# Patient Record
Sex: Female | Born: 1970 | Race: Black or African American | Hispanic: No
Health system: Southern US, Community
[De-identification: ages and names within clinical notes are randomized; demographics above are authoritative.]

---

## 2020-12-23 ENCOUNTER — Emergency Department (HOSPITAL_COMMUNITY)
Admission: EM | Admit: 2020-12-23 | Discharge: 2020-12-23 | Disposition: A | Discharge status: Home / Self Care | Payer: Medicaid Other | Attending: Emergency Medicine | Admitting: Emergency Medicine

## 2020-12-23 ENCOUNTER — Encounter (HOSPITAL_COMMUNITY): Payer: Self-pay

## 2020-12-23 ENCOUNTER — Emergency Department (HOSPITAL_COMMUNITY): Payer: Medicaid Other

## 2020-12-23 ENCOUNTER — Other Ambulatory Visit: Payer: Self-pay

## 2020-12-23 DIAGNOSIS — N200 Calculus of kidney: Secondary | ICD-10-CM | POA: Diagnosis not present

## 2020-12-23 DIAGNOSIS — R0781 Pleurodynia: Secondary | ICD-10-CM | POA: Diagnosis not present

## 2020-12-23 DIAGNOSIS — R109 Unspecified abdominal pain: Secondary | ICD-10-CM | POA: Diagnosis present

## 2020-12-23 DIAGNOSIS — M545 Low back pain, unspecified: Secondary | ICD-10-CM

## 2020-12-23 DIAGNOSIS — Z853 Personal history of malignant neoplasm of breast: Secondary | ICD-10-CM | POA: Insufficient documentation

## 2020-12-23 DIAGNOSIS — M79606 Pain in leg, unspecified: Secondary | ICD-10-CM | POA: Diagnosis not present

## 2020-12-23 LAB — COMPREHENSIVE METABOLIC PANEL
ALT: 21 U/L (ref 0–44)
AST: 39 U/L (ref 15–41)
Albumin: 3.6 g/dL (ref 3.5–5.0)
Alkaline Phosphatase: 110 U/L (ref 38–126)
Anion gap: 9 (ref 5–15)
BUN: 17 mg/dL (ref 6–20)
CO2: 26 mmol/L (ref 22–32)
Calcium: 10.4 mg/dL — ABNORMAL HIGH (ref 8.9–10.3)
Chloride: 103 mmol/L (ref 98–111)
Creatinine, Ser: 0.64 mg/dL (ref 0.44–1.00)
GFR, Estimated: >60 mL/min (ref >60)
Glucose, Bld: 95 mg/dL (ref 70–99)
Potassium: 3.7 mmol/L (ref 3.5–5.1)
Sodium: 138 mmol/L (ref 135–145)
Total Bilirubin: 0.6 mg/dL (ref 0.3–1.2)
Total Protein: 7.5 g/dL (ref 6.5–8.1)

## 2020-12-23 LAB — CBC WITH DIFFERENTIAL/PLATELET
Abs Immature Granulocytes: 0.02 10*3/uL (ref 0.00–0.07)
Basophils Absolute: 0 10*3/uL (ref 0.0–0.1)
Basophils Relative: 0 %
Eosinophils Absolute: 0.2 10*3/uL (ref 0.0–0.5)
Eosinophils Relative: 3 %
HCT: 32.4 % — ABNORMAL LOW (ref 36.0–46.0)
Hemoglobin: 10.4 g/dL — ABNORMAL LOW (ref 12.0–15.0)
Immature Granulocytes: 0 %
Lymphocytes Relative: 21 %
Lymphs Abs: 1.1 10*3/uL (ref 0.7–4.0)
MCH: 28.2 pg (ref 26.0–34.0)
MCHC: 32.1 g/dL (ref 30.0–36.0)
MCV: 87.8 fL (ref 80.0–100.0)
Monocytes Absolute: 0.8 10*3/uL (ref 0.1–1.0)
Monocytes Relative: 15 %
Neutro Abs: 3.2 10*3/uL (ref 1.7–7.7)
Neutrophils Relative %: 61 %
Platelets: 142 10*3/uL — ABNORMAL LOW (ref 150–400)
RBC: 3.69 MIL/uL — ABNORMAL LOW (ref 3.87–5.11)
RDW: 13.1 % (ref 11.5–15.5)
WBC: 5.3 10*3/uL (ref 4.0–10.5)
nRBC: 0 % (ref 0.0–0.2)

## 2020-12-23 LAB — URINALYSIS, ROUTINE W REFLEX MICROSCOPIC
Bilirubin Urine: NEGATIVE
Glucose, UA: NEGATIVE mg/dL
Hgb urine dipstick: NEGATIVE
Ketones, ur: NEGATIVE mg/dL
Leukocytes,Ua: NEGATIVE
Nitrite: NEGATIVE
Protein, ur: NEGATIVE mg/dL
Specific Gravity, Urine: 1.024 (ref 1.005–1.030)
pH: 5 (ref 5.0–8.0)

## 2020-12-23 LAB — I-STAT BETA HCG BLOOD, ED (MC, WL, AP ONLY): I-stat hCG, quantitative: <5 m[IU]/mL (ref <5)

## 2020-12-23 LAB — TROPONIN I (HIGH SENSITIVITY): Troponin I (High Sensitivity): 2 ng/L (ref <18)

## 2020-12-23 MED ORDER — HYDROMORPHONE HCL 1 MG/ML IJ SOLN
1.0000 mg | Freq: Once | INTRAMUSCULAR | Status: AC
  Administered 2020-12-23: 1 mg via INTRAVENOUS
  Filled 2020-12-23: qty 1

## 2020-12-23 MED ORDER — HYDROMORPHONE HCL 1 MG/ML IJ SOLN
1.0000 mg | Freq: Once | INTRAMUSCULAR | Status: AC
Start: 2020-12-23 — End: 2020-12-23
  Administered 2020-12-23: 1 mg via INTRAVENOUS
  Filled 2020-12-23: qty 1

## 2020-12-23 MED ORDER — SODIUM CHLORIDE (PF) 0.9 % IJ SOLN
INTRAMUSCULAR | Status: AC
  Filled 2020-12-23: qty 50

## 2020-12-23 MED ORDER — OXYCODONE HCL 10 MG PO TABS: 10.0000 mg | ORAL_TABLET | Freq: Four times a day (QID) | ORAL | 0 refills | Status: DC | PRN

## 2020-12-23 MED ORDER — ONDANSETRON HCL 4 MG/2ML IJ SOLN
4.0000 mg | Freq: Once | INTRAMUSCULAR | Status: AC
  Administered 2020-12-23: 4 mg via INTRAVENOUS
  Filled 2020-12-23: qty 2

## 2020-12-23 MED ORDER — IOHEXOL 350 MG/ML SOLN
80.0000 mL | Freq: Once | INTRAVENOUS | Status: AC | PRN
  Administered 2020-12-23: 80 mL via INTRAVENOUS

## 2020-12-23 NOTE — Discharge Instructions (Addendum)
Return for any problem.  ?

## 2020-12-23 NOTE — ED Provider Notes (Signed)
[facility] Emergency Department Provider Note MRN:  [id number]  Arrival date & time: 12/23/20     Chief Complaint   Flank Pain   History of Present Illness   Ashley Ortega is a 50 y.o. year-old female with history of stage IV breast cancer presenting to the ED with chief complaint of leg pain.  Pain located in the left flank and left lateral ribs.  Severe constant since 3 hours ago, comes and goes in waves but is always there.  Denies any trouble urinating or trouble with bowel movements, no dysuria, no numbness or weakness to the arms or legs, no lower abdominal pain, no shortness of breath or chest pain  Review of Systems  A complete 10 system review of systems was obtained and all systems are negative except as noted in the HPI and PMH.   Patient's Health History   History reviewed. No pertinent past medical history.    No family history on file.  Social History   Socioeconomic History   Marital status: Unknown    Spouse name: Not on file   Number of children: Not on file   Years of education: Not on file   Highest education level: Not on file  Occupational History   Not on file  Tobacco Use   Smoking status: Not on file   Smokeless tobacco: Not on file  Substance and Sexual Activity   Alcohol use: Not on file   Drug use: Not on file   Sexual activity: Not on file  Other Topics Concern   Not on file  Social History Narrative   Not on file   Social Determinants of Health   Financial Resource Strain: Not on file  Food Insecurity: Not on file  Transportation Needs: Not on file  Physical Activity: Not on file  Stress: Not on file  Social Connections: Not on file  Intimate Partner Violence: Not on file     Physical Exam   Vitals:   12/23/20 0501 12/23/20 0600  BP:  120/71  Pulse:  83  Resp:  17  Temp:    SpO2: 94% 98%    CONSTITUTIONAL: Well-appearing, in moderate distress due to pain NEURO:  Alert and oriented x 3, no focal  deficits EYES:  eyes equal and reactive ENT/NECK:  no LAD, no JVD CARDIO: Regular rate, well-perfused, normal S1 and S2 PULM:  CTAB no wheezing or rhonchi GI/GU:  normal bowel sounds, non-distended, non-tender MSK/SPINE:  No gross deformities, no edema SKIN:  no rash, atraumatic PSYCH:  Appropriate speech and behavior  *Additional and/or pertinent findings included in MDM below  Diagnostic and Interventional Summary    EKG Interpretation  Date/Time:  Sunday December 23 2020 05:22:19 EDT Ventricular Rate:  73 PR Interval:  223 QRS Duration: 81 QT Interval:  359 QTC Calculation: 396 R Axis:   75 Text Interpretation: Sinus rhythm Prolonged PR interval Confirmed by Ashley Ortega [phone]) on 12/23/2020 6:58:29 AM       Labs Reviewed  CBC WITH DIFFERENTIAL/PLATELET - Abnormal; Notable for the following components:      Result Value   RBC 3.69 (*)    Hemoglobin 10.4 (*)    HCT 32.4 (*)    Platelets 142 (*)    All other components within normal limits  COMPREHENSIVE METABOLIC PANEL - Abnormal; Notable for the following components:   Calcium 10.4 (*)    All other components within normal limits  URINALYSIS, ROUTINE W REFLEX MICROSCOPIC  I-STAT BETA HCG  BLOOD, ED (MC, [facility], AP ONLY)  TROPONIN I (HIGH SENSITIVITY)    CT ABDOMEN PELVIS W CONTRAST    (Results Pending)  CT L-SPINE NO CHARGE    (Results Pending)  CT Angio Chest Pulmonary Embolism (PE) W or WO Contrast    (Results Pending)    Medications  sodium chloride (PF) 0.9 % injection (has no administration in time range)  HYDROmorphone (DILAUDID) injection 1 mg (1 mg Intravenous Given 12/23/20 0431)  ondansetron (ZOFRAN) injection 4 mg (4 mg Intravenous Given 12/23/20 0428)  iohexol (OMNIPAQUE) 350 MG/ML injection 80 mL (80 mLs Intravenous Contrast Given 12/23/20 0615)     Procedures  /  Critical Care Procedures  ED Course and Medical Decision Making  I have reviewed the triage vital signs, the nursing notes, and pertinent  available records from the EMR.  Listed above are laboratory and imaging tests that I personally ordered, reviewed, and interpreted and then considered in my medical decision making (see below for details).  CT to evaluate for PE, complication of known stage IV cancer, kidney stone.     Awaiting CT imaging.  Labs reassuring, pain seems adequately controlled.  Pain may be related to known bony mets.  Suspect patient will be appropriate for discharge if imaging is reassuring.  Signed out to oncoming provider at shift change.  Barth Kirks. Sedonia Small, MD [place] [email or web address]  Final Clinical Impressions(s) / ED Diagnoses     ICD-10-CM   1. Flank pain  R10.9     2. Low back pain  M54.50 CT L-SPINE NO CHARGE    CT L-SPINE NO CHARGE      ED Discharge Orders     None        Discharge Instructions Discussed with and Provided to Patient:   Discharge Instructions   None       Ashley Flakes, MD 12/23/20 [phone]

## 2020-12-23 NOTE — ED Provider Notes (Signed)
Patient seen after prior ED provider.  Patient with history of known metastatic stage IV breast cancer.  Patient has declined chemotherapy and radiation or other aggressive treatment.  Patient is scheduled for palliative care consult on the 27th of this month.  Patient reports increased low back and flank pain.  This appears to be secondary to progression of her disease.  Up until this point, patient has used minimal pain medication.  She reports intermittent use of Percocet 2.5/325.  This has been an adequate for her pain control.  Imaging studies from today do not demonstrate evidence of new significant pathology.  She does have evidence of metastatic disease.  This does not appear to be significantly different than in earlier imaging obtained earlier this month.  Patient does feel improved after ED evaluation.  She is agreeable with plan for discharge.  She will be provided with a small amount of oxycodone for pain relief pending her palliative care consult on the 27th.   Valarie Merino, MD 12/23/20 [phone]

## 2020-12-23 NOTE — ED Provider Notes (Signed)
Emergency Medicine Provider Triage Evaluation Note  Ashley Ortega , a 50 y.o. female  was evaluated in triage.  Pt complains of left sided abdominal pain.  Hx of stage 4 breast cancer.  Not on chemo/radiation, but is followed by [facility].  Sudden onset pain tonight.  Has mets to the bones.  Review of Systems  Positive: Left abdominal pain, fall Negative: Vomiting, diarrhea  Physical Exam  There were no vitals taken for this visit. Gen:   Awake, no distress   Resp:  Normal effort  MSK:   Moves extremities without difficulty  Other:  Left flank TTP  Medical Decision Making  Medically screening exam initiated at 2:00 AM.  Appropriate orders placed.  Ashley Ortega was informed that the remainder of the evaluation will be completed by another provider, this initial triage assessment does not replace that evaluation, and the importance of remaining in the ED until their evaluation is complete.  Abdominal pain   Montine Circle, PA-C 12/23/20 0202    [name], MD 12/23/20 [phone]

## 2020-12-23 NOTE — ED Triage Notes (Signed)
Pt came in via EMS with c/o L sided flank/back pain. Pain caused her to fall but no injury noted. Pt has hx of stage 4 breast cancer

## 2021-01-14 ENCOUNTER — Encounter (HOSPITAL_COMMUNITY): Payer: Self-pay | Admitting: *Deleted

## 2021-01-14 ENCOUNTER — Emergency Department (HOSPITAL_COMMUNITY): Payer: Medicaid Other

## 2021-01-14 ENCOUNTER — Inpatient Hospital Stay (HOSPITAL_COMMUNITY)
Admission: EM | Admit: 2021-01-14 | Discharge: 2021-01-21 | DRG: 948 | Disposition: A | Discharge status: Home Health | Payer: Medicaid Other | Attending: Internal Medicine | Admitting: Internal Medicine

## 2021-01-14 DIAGNOSIS — K5900 Constipation, unspecified: Secondary | ICD-10-CM | POA: Diagnosis present

## 2021-01-14 DIAGNOSIS — Z515 Encounter for palliative care: Secondary | ICD-10-CM | POA: Diagnosis not present

## 2021-01-14 DIAGNOSIS — G893 Neoplasm related pain (acute) (chronic): Principal | ICD-10-CM | POA: Diagnosis present

## 2021-01-14 DIAGNOSIS — C50911 Malignant neoplasm of unspecified site of right female breast: Secondary | ICD-10-CM | POA: Diagnosis present

## 2021-01-14 DIAGNOSIS — Z681 Body mass index (BMI) 19 or less, adult: Secondary | ICD-10-CM | POA: Diagnosis not present

## 2021-01-14 DIAGNOSIS — C50912 Malignant neoplasm of unspecified site of left female breast: Secondary | ICD-10-CM | POA: Diagnosis present

## 2021-01-14 DIAGNOSIS — Z21 Asymptomatic human immunodeficiency virus [HIV] infection status: Secondary | ICD-10-CM | POA: Diagnosis present

## 2021-01-14 DIAGNOSIS — C799 Secondary malignant neoplasm of unspecified site: Secondary | ICD-10-CM

## 2021-01-14 DIAGNOSIS — R64 Cachexia: Secondary | ICD-10-CM | POA: Diagnosis present

## 2021-01-14 DIAGNOSIS — Z86718 Personal history of other venous thrombosis and embolism: Secondary | ICD-10-CM | POA: Diagnosis not present

## 2021-01-14 DIAGNOSIS — Z20822 Contact with and (suspected) exposure to covid-19: Secondary | ICD-10-CM | POA: Diagnosis present

## 2021-01-14 DIAGNOSIS — E440 Moderate protein-calorie malnutrition: Secondary | ICD-10-CM | POA: Diagnosis present

## 2021-01-14 DIAGNOSIS — Z79899 Other long term (current) drug therapy: Secondary | ICD-10-CM

## 2021-01-14 DIAGNOSIS — Z7952 Long term (current) use of systemic steroids: Secondary | ICD-10-CM | POA: Diagnosis not present

## 2021-01-14 DIAGNOSIS — R52 Pain, unspecified: Secondary | ICD-10-CM | POA: Diagnosis not present

## 2021-01-14 DIAGNOSIS — C50919 Malignant neoplasm of unspecified site of unspecified female breast: Secondary | ICD-10-CM

## 2021-01-14 DIAGNOSIS — F419 Anxiety disorder, unspecified: Secondary | ICD-10-CM | POA: Diagnosis present

## 2021-01-14 DIAGNOSIS — R531 Weakness: Secondary | ICD-10-CM | POA: Diagnosis not present

## 2021-01-14 DIAGNOSIS — C7951 Secondary malignant neoplasm of bone: Secondary | ICD-10-CM | POA: Diagnosis present

## 2021-01-14 DIAGNOSIS — Z7189 Other specified counseling: Secondary | ICD-10-CM | POA: Diagnosis not present

## 2021-01-14 DIAGNOSIS — R140 Abdominal distension (gaseous): Secondary | ICD-10-CM

## 2021-01-14 LAB — HEPATIC FUNCTION PANEL
ALT: 18 U/L (ref 0–44)
AST: 45 U/L — ABNORMAL HIGH (ref 15–41)
Albumin: 3.7 g/dL (ref 3.5–5.0)
Alkaline Phosphatase: 143 U/L — ABNORMAL HIGH (ref 38–126)
Bilirubin, Direct: 0.2 mg/dL (ref 0.0–0.2)
Indirect Bilirubin: 0.9 mg/dL (ref 0.3–0.9)
Total Bilirubin: 1.1 mg/dL (ref 0.3–1.2)
Total Protein: 8.5 g/dL — ABNORMAL HIGH (ref 6.5–8.1)

## 2021-01-14 LAB — CBC WITH DIFFERENTIAL/PLATELET
Abs Immature Granulocytes: 0.01 10*3/uL (ref 0.00–0.07)
Basophils Absolute: 0 10*3/uL (ref 0.0–0.1)
Basophils Relative: 0 %
Eosinophils Absolute: 0 10*3/uL (ref 0.0–0.5)
Eosinophils Relative: 0 %
HCT: 34 % — ABNORMAL LOW (ref 36.0–46.0)
Hemoglobin: 11.3 g/dL — ABNORMAL LOW (ref 12.0–15.0)
Immature Granulocytes: 0 %
Lymphocytes Relative: 16 %
Lymphs Abs: 1.3 10*3/uL (ref 0.7–4.0)
MCH: 28.5 pg (ref 26.0–34.0)
MCHC: 33.2 g/dL (ref 30.0–36.0)
MCV: 85.6 fL (ref 80.0–100.0)
Monocytes Absolute: 1.1 10*3/uL — ABNORMAL HIGH (ref 0.1–1.0)
Monocytes Relative: 14 %
Neutro Abs: 5.4 10*3/uL (ref 1.7–7.7)
Neutrophils Relative %: 70 %
Platelets: 188 10*3/uL (ref 150–400)
RBC: 3.97 MIL/uL (ref 3.87–5.11)
RDW: 13.3 % (ref 11.5–15.5)
WBC: 7.8 10*3/uL (ref 4.0–10.5)
nRBC: 0 % (ref 0.0–0.2)

## 2021-01-14 LAB — BASIC METABOLIC PANEL
Anion gap: 11 (ref 5–15)
Anion gap: 7 (ref 5–15)
BUN: 15 mg/dL (ref 6–20)
BUN: 13 mg/dL (ref 6–20)
CO2: 27 mmol/L (ref 22–32)
CO2: 27 mmol/L (ref 22–32)
Calcium: 13.6 mg/dL (ref 8.9–10.3)
Calcium: 12.2 mg/dL — ABNORMAL HIGH (ref 8.9–10.3)
Chloride: 96 mmol/L — ABNORMAL LOW (ref 98–111)
Chloride: 101 mmol/L (ref 98–111)
Creatinine, Ser: 0.74 mg/dL (ref 0.44–1.00)
Creatinine, Ser: 0.6 mg/dL (ref 0.44–1.00)
GFR, Estimated: >60 mL/min (ref >60)
GFR, Estimated: >60 mL/min (ref >60)
Glucose, Bld: 98 mg/dL (ref 70–99)
Glucose, Bld: 84 mg/dL (ref 70–99)
Potassium: 4.1 mmol/L (ref 3.5–5.1)
Potassium: 3.3 mmol/L — ABNORMAL LOW (ref 3.5–5.1)
Sodium: 134 mmol/L — ABNORMAL LOW (ref 135–145)
Sodium: 135 mmol/L (ref 135–145)

## 2021-01-14 LAB — BRAIN NATRIURETIC PEPTIDE: B Natriuretic Peptide: 17.7 pg/mL (ref 0.0–100.0)

## 2021-01-14 LAB — TROPONIN I (HIGH SENSITIVITY)
Troponin I (High Sensitivity): 5 ng/L (ref <18)
Troponin I (High Sensitivity): 6 ng/L (ref <18)

## 2021-01-14 MED ORDER — IOHEXOL 350 MG/ML SOLN
100.0000 mL | Freq: Once | INTRAVENOUS | Status: AC | PRN
  Administered 2021-01-14: 80 mL via INTRAVENOUS

## 2021-01-14 MED ORDER — BICTEGRAVIR-EMTRICITAB-TENOFOV 50-200-25 MG PO TABS
1.0000 | ORAL_TABLET | Freq: Every day | ORAL | Status: DC
  Administered 2021-01-15 – 2021-01-21 (×7): 1 via ORAL
  Filled 2021-01-14 (×8): qty 1

## 2021-01-14 MED ORDER — LACTATED RINGERS IV BOLUS
1000.0000 mL | Freq: Once | INTRAVENOUS | Status: AC
  Administered 2021-01-14: 1000 mL via INTRAVENOUS

## 2021-01-14 MED ORDER — CALCITONIN (SALMON) 200 UNIT/ML IJ SOLN: 4.0000 [IU]/kg | Freq: Two times a day (BID) | INTRAMUSCULAR | Status: DC

## 2021-01-14 MED ORDER — FUROSEMIDE 20 MG PO TABS
20.0000 mg | ORAL_TABLET | Freq: Every day | ORAL | Status: DC
  Administered 2021-01-15 – 2021-01-21 (×7): 20 mg via ORAL
  Filled 2021-01-14 (×7): qty 1

## 2021-01-14 MED ORDER — OXYCODONE HCL 5 MG PO TABS
10.0000 mg | ORAL_TABLET | Freq: Four times a day (QID) | ORAL | Status: DC | PRN
  Filled 2021-01-14: qty 2

## 2021-01-14 MED ORDER — SODIUM CHLORIDE 0.9 % IV SOLN: INTRAVENOUS | Status: AC

## 2021-01-14 MED ORDER — PREDNISONE 20 MG PO TABS
40.0000 mg | ORAL_TABLET | Freq: Every day | ORAL | Status: DC
  Administered 2021-01-15 – 2021-01-17 (×3): 40 mg via ORAL
  Filled 2021-01-14 (×5): qty 2

## 2021-01-14 MED ORDER — POLYETHYLENE GLYCOL 3350 17 G PO PACK
17.0000 g | PACK | Freq: Every day | ORAL | Status: DC
  Administered 2021-01-16: 17 g via ORAL
  Filled 2021-01-14: qty 1

## 2021-01-14 MED ORDER — HYDROMORPHONE HCL 1 MG/ML IJ SOLN
1.0000 mg | Freq: Once | INTRAMUSCULAR | Status: AC
  Administered 2021-01-14: 1 mg via INTRAVENOUS
  Filled 2021-01-14: qty 1

## 2021-01-14 MED ORDER — HYDROMORPHONE HCL 1 MG/ML IJ SOLN
0.5000 mg | INTRAMUSCULAR | Status: AC | PRN
  Administered 2021-01-14 – 2021-01-15 (×2): 0.5 mg via INTRAVENOUS
  Filled 2021-01-14 (×2): qty 1

## 2021-01-14 MED ORDER — ZOLEDRONIC ACID 4 MG/5ML IV CONC
4.0000 mg | Freq: Once | INTRAVENOUS | Status: AC
  Administered 2021-01-15: 4 mg via INTRAVENOUS
  Filled 2021-01-14 (×2): qty 5

## 2021-01-14 MED ORDER — ENSURE ENLIVE PO LIQD
237.0000 mL | Freq: Two times a day (BID) | ORAL | Status: DC
  Administered 2021-01-15 – 2021-01-21 (×10): 237 mL via ORAL
  Filled 2021-01-14 (×2): qty 237

## 2021-01-14 MED ORDER — IOHEXOL 350 MG/ML SOLN: 100.0000 mL | Freq: Once | INTRAVENOUS | Status: DC | PRN

## 2021-01-14 MED ORDER — ZOLEDRONIC ACID 5 MG/100ML IV SOLN: 5.0000 mg | Freq: Once | INTRAVENOUS | Status: DC

## 2021-01-14 MED ORDER — BISACODYL 10 MG RE SUPP: 10.0000 mg | Freq: Every day | RECTAL | Status: DC | PRN

## 2021-01-14 NOTE — H&P (Signed)
History and Physical    Ashley Ortega [id number] DOB: Nov 17, 1970 DOA: 01/14/2021  PCP: Pcp, No (Confirm with patient/family/NH records and if not entered, this has to be entered at [facility] point of entry) Patient coming from: Home  I have personally briefly reviewed patient's old medical records in [place]  Chief Complaint: pain everywhere  HPI: Ashley Ortega is a 50 y.o. 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  Patient was diagnosed with breast cancer in 2020.  Patient was started on chemotherapy but did not tolerate after 2 cycles.  Last treatment was in April 2021 in [place].  Since about 3-4 weeks ago patient has had increasing shortness of breath and back pain, she came to [facility] ED on July 24, CT angiogram negative for PE.  Patient has been following with her oncologist at [facility], and recent CT scan showed patient has had worsening of metastatic lesion diffused distributed in her bones.  And patient declined further chemotherapy.  Oncology recently started her on palliative care with high-dose of narcotics.  ED Course: Calcium 13.6, creatinine 2.7, CTA negative for PE  Review of Systems: As per HPI otherwise 14 point review of systems negative.    History reviewed. No pertinent past medical history.  History reviewed. No pertinent surgical history.   has no history on file for tobacco use, alcohol use, and drug use.  No Known Allergies  No family history on file.   Prior to Admission medications   Medication Sig Start Date End Date Taking? Authorizing Provider  bictegravir-emtricitabine-tenofovir AF (BIKTARVY) 50-200-25 MG TABS tablet Take 1 tablet by mouth daily. 10/09/20  Yes [provider]  dexamethasone (DECADRON) 4 MG tablet Take 4 mg by mouth 2 (two) times daily  with a meal.   Yes [provider]  oxyCODONE 10 MG TABS Take 1 tablet (10 mg total) by mouth every 6 (six) hours as needed for severe pain. 12/23/20  Yes Valarie Merino, MD    Physical Exam: Vitals:   01/14/21 1400 01/14/21 1540 01/14/21 1645 01/14/21 1745  BP: 119/87 (!) 131/104 106/84 (!) 119/92  Pulse: (!) 114 (!) 118 (!) 111 (!) 106  Resp: 15 18 (!) 31 19  Temp:      TempSrc:      SpO2: 98% 98% 97% 98%  Weight:      Height:        Constitutional: NAD, calm, comfortable Vitals:   01/14/21 1400 01/14/21 1540 01/14/21 1645 01/14/21 1745  BP: 119/87 (!) 131/104 106/84 (!) 119/92  Pulse: (!) 114 (!) 118 (!) 111 (!) 106  Resp: 15 18 (!) 31 19  Temp:      TempSrc:      SpO2: 98% 98% 97% 98%  Weight:      Height:       Eyes: PERRL, lids and conjunctivae normal ENMT: Mucous membranes are moist. Posterior pharynx clear of any exudate or lesions.Normal dentition.  Neck: normal, supple, no masses, no thyromegaly Respiratory: clear to auscultation bilaterally, no wheezing, no crackles. Normal respiratory effort. No accessory muscle use.  Cardiovascular: Regular rate and rhythm, no murmurs / rubs / gallops. No extremity edema. 2+ pedal pulses. No carotid bruits.  Abdomen: no tenderness, no masses palpated. No hepatosplenomegaly. Bowel sounds positive.  Musculoskeletal: no clubbing / cyanosis. No joint deformity upper and lower  extremities. Good ROM, no contractures. Normal muscle tone.  Skin: no rashes, lesions, ulcers. No induration Neurologic: CN 2-12 grossly intact. Sensation intact, DTR normal. Strength 5/5 in all 4.  Psychiatric: Normal judgment and insight. Alert and oriented x 3. Normal mood.     Labs on Admission: I have personally reviewed following labs and imaging studies  CBC: Recent Labs  Lab 01/14/21 1335  WBC 7.8  NEUTROABS 5.4  HGB 11.3*  HCT 34.0*  MCV 85.6  PLT 0000000   Basic Metabolic Panel: Recent Labs  Lab 01/14/21 1135  NA 134*  K 4.1   CL 96*  CO2 27  GLUCOSE 98  BUN 15  CREATININE 0.74  CALCIUM 13.6*   GFR: Estimated Creatinine Clearance: 68.5 mL/min (by C-G formula based on SCr of 0.74 mg/dL). Liver Function Tests: Recent Labs  Lab 01/14/21 1335  AST 45*  ALT 18  ALKPHOS 143*  BILITOT 1.1  PROT 8.5*  ALBUMIN 3.7   No results for input(s): LIPASE, AMYLASE in the last 168 hours. No results for input(s): AMMONIA in the last 168 hours. Coagulation Profile: No results for input(s): INR, PROTIME in the last 168 hours. Cardiac Enzymes: No results for input(s): CKTOTAL, CKMB, CKMBINDEX, TROPONINI in the last 168 hours. BNP (last 3 results) No results for input(s): PROBNP in the last 8760 hours. HbA1C: No results for input(s): HGBA1C in the last 72 hours. CBG: No results for input(s): GLUCAP in the last 168 hours. Lipid Profile: No results for input(s): CHOL, HDL, LDLCALC, TRIG, CHOLHDL, LDLDIRECT in the last 72 hours. Thyroid Function Tests: No results for input(s): TSH, T4TOTAL, FREET4, T3FREE, THYROIDAB in the last 72 hours. Anemia Panel: No results for input(s): VITAMINB12, FOLATE, FERRITIN, TIBC, IRON, RETICCTPCT in the last 72 hours. Urine analysis:    Component Value Date/Time   COLORURINE STRAW (A) 12/23/2020 0720   APPEARANCEUR CLEAR 12/23/2020 0720   LABSPEC 1.024 12/23/2020 0720   PHURINE 5.0 12/23/2020 0720   GLUCOSEU NEGATIVE 12/23/2020 0720   HGBUR NEGATIVE 12/23/2020 0720   BILIRUBINUR NEGATIVE 12/23/2020 0720   KETONESUR NEGATIVE 12/23/2020 0720   PROTEINUR NEGATIVE 12/23/2020 0720   NITRITE NEGATIVE 12/23/2020 0720   LEUKOCYTESUR NEGATIVE 12/23/2020 0720    Radiological Exams on Admission: DG Chest 2 View  Result Date: 01/14/2021 CLINICAL DATA:  Shortness of breath EXAM: CHEST - 2 VIEW COMPARISON:  CT chest dated December 23, 2020 FINDINGS: Cardiac and mediastinal contours unchanged and within normal limits. Bibasilar atelectasis. Lungs otherwise clear. No evidence of pleural  effusion or pneumothorax. Scattered lytic osseous lesions mild deviation of the left paraspinal line, compatible with known osseous metastatic disease. IMPRESSION: No acute cardiopulmonary disease. Electronically Signed   By: Yetta Glassman M.D.   On: 01/14/2021 12:13   CT Angio Chest PE W/Cm &/Or Wo Cm  Result Date: 01/14/2021 CLINICAL DATA:  Metastatic breast cancer with recent progression. Shortness of breath for 2 days. Chest pain starting last night. EXAM: CT ANGIOGRAPHY CHEST WITH CONTRAST TECHNIQUE: Multidetector CT imaging of the chest was performed using the standard protocol during bolus administration of intravenous contrast. Multiplanar CT image reconstructions and MIPs were obtained to evaluate the vascular anatomy. CONTRAST:  21m OMNIPAQUE IOHEXOL 350 MG/ML SOLN COMPARISON:  12/23/2020 FINDINGS: Cardiovascular: No filling defect is identified in the pulmonary arterial tree to suggest pulmonary embolus. Mild aortic arch atherosclerotic calcification. Mediastinum/Nodes: Right infrahilar node 0.7 cm in short axis on image 69 series 6, stable. Similar sized left infrahilar lymph node. Scattered small left axillary  lymph nodes. Lungs/Pleura: Increased subsegmental atelectasis in the posterior basal segment of the right lower lobe. Mild scarring or atelectasis medially in the left lower lobe. Mild lingular scarring. Upper Abdomen: The hepatic metastatic lesions seen on 12/23/2020 are poorly appreciated on today's CTA chest, possibly related to the phase of contrast. A dominant lesion posteriorly in the dome of the right hepatic lobe is only very faintly appreciable on image 97 series 6. Musculoskeletal: Rapidly progressive widespread lytic metastatic disease throughout the chest including multiple lytic rib lesions which have worsened in addition to numerous lesions in the thoracic spine which have enlarged from 12/23/2020. The largest spinal lesion continues to be the large geographic vertebral  lesion at L1 with right posterior element involvement and suspected epidural tumor involvement. An index lesion at T10 measures 1.9 by 1.5 cm on image 72 series 10, previously 1.2 by 1.2 cm. Cutaneous thickening in both breasts is stable. Review of the MIP images confirms the above findings. IMPRESSION: 1. No filling defect is identified in the pulmonary arterial tree to suggest pulmonary embolus. 2. Further progression of lytic metastatic lesions with lytic destructive rib lesions and vertebral lesions. Of the various scattered lytic spine lesions, the largest is at the L1-2 level with the mass involving the vertebral body and right posterior elements and potentially substantially extending into the spinal canal/epidural space. 3. The hepatic metastatic lesions are poorly seen on today's CTA chest, possibly related to the phase of contrast. 4.  Aortic Atherosclerosis (ICD10-I70.0). Electronically Signed   By: Van Clines M.D.   On: 01/14/2021 17:05    EKG: Independently reviewed.  Sinus tachycardia, nonspecific ST changes multiple leads.  Assessment/Plan Active Problems:   Hypercalcemia of malignancy   Hypercalcemia  (please populate well all problems here in Problem List. (For example, if patient is on BP meds at home and you resume or decide to hold them, it is a problem that needs to be her. Same for CAD, COPD, HLD and so on)  Hypercalcemia -Secondary to diffuse metastatic cancer to vertebral and pelvic and other bones. -Discussed with patient about prognosis, all questions answered with my best knowledge, patient agreed to continue follow-up with oncology to discuss restarting chemotherapy. -Discussed with pharmacy, calcium<14, not qualify for calcitonin, will treat with biphosphonate and IV hydration and start steroid -Add Lasix starting tomorrow.  Stage IV metastatic breast cancer -Patient agreed to follow-up with oncologist at [facility] -As patient's wish, consult palliative  care.  Severe protein calorie malnutrition/cachexia -Start protein supplement -Consult dietitian.  HIV -Continue HAART  DVT prophylaxis: SCD Code Status: Full code Family Communication: None at bedside Disposition Plan: Expect 1 to 2 days hospital stay Consults called: Palliative care Admission status: MedSurg admission   Lequita Halt MD [facility] Pager [phone]  01/14/2021, 6:24 PM

## 2021-01-14 NOTE — ED Provider Notes (Addendum)
[facility] DEPT Provider Note   CSN: [id number] Arrival date & time: 01/14/21  1011     History Chief Complaint  Patient presents with   Shortness of Breath    Ashley Ortega is a 50 y.o. female.  HPI     50 year old female comes in with chief complaint of shortness of breath. 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  She comes in with chief complaint of back pain, neck pain for the last few days.  She has been taking pain medications with intermittent, transient relief.  She is also complaining of some shortness of breath.  In the past she was diagnosed with PE.  She is currently not on any chemotherapy or any blood thinners.  Review of system is positive for abdominal cramping, weakness.  History reviewed. No pertinent past medical history.  There are no problems to display for this patient.   History reviewed. No pertinent surgical history.   OB History   No obstetric history on file.     No family history on file.     Home Medications Prior to Admission medications   Medication Sig Start Date End Date Taking? Authorizing Provider  bictegravir-emtricitabine-tenofovir AF (BIKTARVY) 50-200-25 MG TABS tablet Take 1 tablet by mouth daily. 10/09/20  Yes [provider]  dexamethasone (DECADRON) 4 MG tablet Take 4 mg by mouth 2 (two) times daily with a meal.   Yes [provider]  oxyCODONE 10 MG TABS Take 1 tablet (10 mg total) by mouth every 6 (six) hours as needed for severe pain. 12/23/20  Yes Valarie Merino, MD    Allergies    Patient has no known allergies.  Review of Systems   Review of Systems  Constitutional:  Positive for activity change.  Respiratory:  Positive for shortness of breath.   Cardiovascular:  Negative for chest pain.  Gastrointestinal:  Positive for nausea. Negative for vomiting.  Musculoskeletal:  Positive for arthralgias and myalgias.   All other systems reviewed and are negative.  Physical Exam Updated Vital Signs BP (!) 131/104   Pulse (!) 118   Temp 99.5 F (37.5 C) (Oral)   Resp 18   Ht '5\' 6"'$  (1.676 m)   Wt 49.9 kg   SpO2 98%   BMI 17.75 kg/m   Physical Exam Vitals and nursing note reviewed.  Constitutional:      Appearance: She is well-developed.  HENT:     Head: Atraumatic.  Cardiovascular:     Rate and Rhythm: Normal rate.  Pulmonary:     Effort: Pulmonary effort is normal.  Abdominal:     Palpations: Abdomen is soft.  Musculoskeletal:     Cervical back: Normal range of motion and neck supple.  Skin:    General: Skin is warm and dry.  Neurological:     Mental Status: She is alert and oriented to person, place, and time.    ED Results / Procedures / Treatments   Labs (all labs ordered are listed, but only abnormal results are displayed) Labs Reviewed  BASIC METABOLIC PANEL - Abnormal; Notable for the following components:      Result Value   Sodium 134 (*)    Chloride 96 (*)    Calcium 13.6 (*)    All other components within normal limits  CBC WITH DIFFERENTIAL/PLATELET - Abnormal; Notable for the following components:   Hemoglobin 11.3 (*)    HCT 34.0 (*)  Monocytes Absolute 1.1 (*)    All other components within normal limits  HEPATIC FUNCTION PANEL - Abnormal; Notable for the following components:   Total Protein 8.5 (*)    AST 45 (*)    Alkaline Phosphatase 143 (*)    All other components within normal limits  SARS CORONAVIRUS 2 (TAT 6-24 HRS)  BRAIN NATRIURETIC PEPTIDE  BASIC METABOLIC PANEL  CALCIUM, IONIZED  TROPONIN I (HIGH SENSITIVITY)  TROPONIN I (HIGH SENSITIVITY)    EKG EKG Interpretation  Date/Time:  Monday January 14 2021 12:22:55 EDT Ventricular Rate:  110 PR Interval:  204 QRS Duration: 73 QT Interval:  290 QTC Calculation: 393 R Axis:   67 Text Interpretation: Sinus tachycardia Borderline prolonged PR interval ST elevation, consider inferior injury No  acute changes No significant change since last tracing Confirmed by Varney Biles [id number]) on 01/14/2021 2:33:07 PM  Radiology DG Chest 2 View  Result Date: 01/14/2021 CLINICAL DATA:  Shortness of breath EXAM: CHEST - 2 VIEW COMPARISON:  CT chest dated December 23, 2020 FINDINGS: Cardiac and mediastinal contours unchanged and within normal limits. Bibasilar atelectasis. Lungs otherwise clear. No evidence of pleural effusion or pneumothorax. Scattered lytic osseous lesions mild deviation of the left paraspinal line, compatible with known osseous metastatic disease. IMPRESSION: No acute cardiopulmonary disease. Electronically Signed   By: Yetta Glassman M.D.   On: 01/14/2021 12:13    Procedures .Critical Care  Date/Time: 01/14/2021 2:33 PM Performed by: Varney Biles, MD Authorized by: Varney Biles, MD   Critical care provider statement:    Critical care time (minutes):  52   Critical care was necessary to treat or prevent imminent or life-threatening deterioration of the following conditions:  Metabolic crisis   Critical care was time spent personally by me on the following activities:  Discussions with consultants, evaluation of patient's response to treatment, examination of patient, ordering and performing treatments and interventions, ordering and review of laboratory studies, ordering and review of radiographic studies, pulse oximetry, re-evaluation of patient's condition, obtaining history from patient or surrogate and review of old charts   Medications Ordered in ED Medications  iohexol (OMNIPAQUE) 350 MG/ML injection 100 mL (has no administration in time range)  lactated ringers bolus 1,000 mL (has no administration in time range)  0.9 %  sodium chloride infusion (has no administration in time range)  zolendronic acid (ZOMETA) 4 mg in sodium chloride 0.9 % 100 mL IVPB (has no administration in time range)  lactated ringers bolus 1,000 mL (1,000 mLs Intravenous New Bag/Given 01/14/21  1406)  HYDROmorphone (DILAUDID) injection 1 mg (1 mg Intravenous Given 01/14/21 1405)  iohexol (OMNIPAQUE) 350 MG/ML injection 100 mL (80 mLs Intravenous Contrast Given 01/14/21 1619)    ED Course  I have reviewed the triage vital signs and the nursing notes.  Pertinent labs & imaging results that were available during my care of the patient were reviewed by me and considered in my medical decision making (see chart for details).  Clinical Course as of 01/14/21 1629  Mon Jan 14, 2021  1629 I was going to order calcitonin for the patient.  It appears that a bisphosphonate order has been placed by the hospitalist.  I will not add calcitonin at this time.  Let the medicine manage the hypercalcemia. [AN]    Clinical Course User Index [AN] Varney Biles, MD   MDM Rules/Calculators/A&P  Final Clinical Impression(s) / ED Diagnoses Final diagnoses:  Hypercalcemia  Metastatic malignant neoplasm, unspecified site [facility])   50 year old female with history of metastatic breast cancer not on chemotherapy comes in a chief complaint of pain and shortness of breath.  She was seen in the ER recently and had a CT scan of her chest, abdomen and pelvis.  At that time she did not have any PE.  There was findings consistent with increased progression of her cancer and also new lesions to her liver.  Her abdominal exam currently is reassuring.  She is tachycardic, with my suspicion for PE as the underlying process is low.  Basic labs were ordered and there is no evidence of AKI.  She however does have elevated calcium of 13.6.  With the hypercalcemia she is complaining of some GI symptoms and malaise, weakness.  No altered mental status.  EKG is reassuring.  Patient will be admitted to the hospital for hypercalcemia and pain control.   Rx / DC Orders ED Discharge Orders     None          Varney Biles, MD 01/14/21 [place], Anikah Hogge, MD 01/14/21 1630

## 2021-01-14 NOTE — ED Provider Notes (Signed)
Emergency Medicine Provider Triage Evaluation Note  Ashley Ortega , a 50 y.o. female  was evaluated in triage.  Pt complains of shortness of breath x2 days.  Patient is a challenging historian.  She is stage IV breast cancer and recent history of PEs, states she was on Xarelto but stopped taking it because it made her feel bad.  She also endorses chest pain that started last night..  She states that her neck feels heavy and she is unable to keep her head up, is requesting a C-spine collar.  She also reports having generalized pain and back pain.  Denies any trauma.  Review of Systems  Positive: Chest pain, shortness of breath Negative:   Physical Exam  BP (!) 149/100 (BP Location: Right Arm)   Pulse (!) 107   Temp 99.5 F (37.5 C) (Oral)   Resp 14   Ht '5\' 6"'$  (1.676 m)   Wt 49.9 kg   SpO2 99%   BMI 17.75 kg/m  Gen:   Awake, no distress   Resp:  Normal effort  MSK:   Moves extremities without difficulty  Other:    Medical Decision Making  Medically screening exam initiated at 11:39 AM.  Appropriate orders placed.  Roland Bladow was informed that the remainder of the evaluation will be completed by another provider, this initial triage assessment does not replace that evaluation, and the importance of remaining in the ED until their evaluation is complete.     Sherrill Raring, PA-C 01/14/21 [place], [place], MD 01/14/21 1623

## 2021-01-14 NOTE — ED Triage Notes (Signed)
Per EMS, pt sent from home. Hx stage 4 breast cancer. Complains of SOB, back pain/neck pain x 2 days. Concerned cancer has spread to back, requested c-collar. Pt reports she was dx with PE at [place] more than 1 month ago, is not on any treatment.   130/90 HR 118 O2 98% CBG 106 afebrile

## 2021-01-15 ENCOUNTER — Other Ambulatory Visit: Payer: Self-pay

## 2021-01-15 DIAGNOSIS — Z515 Encounter for palliative care: Secondary | ICD-10-CM

## 2021-01-15 DIAGNOSIS — R52 Pain, unspecified: Secondary | ICD-10-CM | POA: Diagnosis not present

## 2021-01-15 DIAGNOSIS — Z7189 Other specified counseling: Secondary | ICD-10-CM | POA: Diagnosis not present

## 2021-01-15 LAB — BASIC METABOLIC PANEL
Anion gap: 6 (ref 5–15)
BUN: 14 mg/dL (ref 6–20)
CO2: 26 mmol/L (ref 22–32)
Calcium: 12.3 mg/dL — ABNORMAL HIGH (ref 8.9–10.3)
Chloride: 107 mmol/L (ref 98–111)
Creatinine, Ser: 0.61 mg/dL (ref 0.44–1.00)
GFR, Estimated: >60 mL/min (ref >60)
Glucose, Bld: 78 mg/dL (ref 70–99)
Potassium: 3.5 mmol/L (ref 3.5–5.1)
Sodium: 139 mmol/L (ref 135–145)

## 2021-01-15 LAB — SARS CORONAVIRUS 2 (TAT 6-24 HRS): SARS Coronavirus 2: NEGATIVE

## 2021-01-15 MED ORDER — HYDROMORPHONE HCL 1 MG/ML IJ SOLN
1.0000 mg | INTRAMUSCULAR | Status: DC | PRN
  Administered 2021-01-15 – 2021-01-21 (×15): 1 mg via INTRAVENOUS
  Filled 2021-01-15 (×16): qty 1

## 2021-01-15 MED ORDER — ENOXAPARIN SODIUM 40 MG/0.4ML IJ SOSY
40.0000 mg | PREFILLED_SYRINGE | INTRAMUSCULAR | Status: DC
  Administered 2021-01-15 – 2021-01-20 (×6): 40 mg via SUBCUTANEOUS
  Filled 2021-01-15 (×6): qty 0.4

## 2021-01-15 MED ORDER — DOCUSATE SODIUM 100 MG PO CAPS
100.0000 mg | ORAL_CAPSULE | Freq: Two times a day (BID) | ORAL | Status: DC
  Administered 2021-01-15 – 2021-01-16 (×3): 100 mg via ORAL
  Filled 2021-01-15 (×3): qty 1

## 2021-01-15 MED ORDER — TRAMADOL HCL 50 MG PO TABS
50.0000 mg | ORAL_TABLET | Freq: Four times a day (QID) | ORAL | Status: DC | PRN
  Administered 2021-01-15: 50 mg via ORAL
  Filled 2021-01-15: qty 1

## 2021-01-15 NOTE — Consult Note (Signed)
Consultation Note Date: 01/15/2021   Patient Name: Ashley Ortega  DOB: 1971/01/03  MRN: [id number]  Age / Sex: 50 y.o., female  PCP: Pcp, No Referring Physician: Dessa Phi, DO  Reason for Consultation: Establishing goals of care  HPI/Patient Profile: 50 y.o. 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  She has past medical history significant for stage IV metastatic breast cancer to thoracic and lumbar vertebrae, HIV on [place], and patient's oncology care is over at [facility].  It is reported that patient's recent CT scan apparently showed worsening of metastatic diffuse lesions in her bones and the patient declined further chemotherapy.  Patient presented to [facility] emergency department and has been admitted to hospital medicine service.  She has been admitted for hypercalcemia of malignancy, stage IV metastatic bilateral breast cancer with bone pain.  Palliative medicine consulted for pain management and goals of care discussions.  Chart reviewed, medication history reviewed, patient seen and examined.  I introduced myself and palliative care as follows: Palliative medicine is specialized medical care for people living with serious illness. It focuses on providing relief from the symptoms and stress of a serious illness. The goal is to improve quality of life for both the patient and the family. Goals of care: Broad aims of medical therapy in relation to the patient's values and preferences. Our aim is to provide medical care aimed at enabling patients to achieve the goals that matter most to them, given the circumstances of their particular medical situation and their constraints.   Life review performed.  Goals wishes and values attempted to be  explored.  Cancer history discussed.  Patient has 4 children.  Her youngest is 21 and lives with his father in [place], [place].  Patient is separated for the past several years and designates her sister as her main source of support.  She has not completed any advance care planning documents and is interested in meeting with chaplain here in the hospital to complete a living will and to designate a healthcare power of attorney agent.  She elects her sister, chaplain consult placed.  Patient wishes to establish with oncology locally in [place], [place], does not want to go to [facility] anymore.  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  She elects for continuation of full code/full scope at this time.  NEXT OF KIN Daughter and sister who live in [place], has a 65 year old.  SUMMARY OF RECOMMENDATIONS   1.  Patient elects for continuation of full code/full scope status. 2.  Patient states that she wishes to establish locally with medical oncology at [facility] here in [place], [place]. 3.  Continue current efforts at pain and on pain symptom management, monitor use. 4.  Chaplain consult for completion of advance care planning documents. PMT to follow, thank you for the  consult. Code Status/Advance Care Planning: Full code   Symptom Management:     Palliative Prophylaxis:  Frequent Pain Assessment  Additional Recommendations (Limitations, Scope, Preferences): Full Scope Treatment  Psycho-social/Spiritual:  Desire for further Chaplaincy support:yes Additional Recommendations: Caregiving  Support/Resources  Prognosis:  Unable to determine  Discharge Planning: To Be Determined      Primary Diagnoses: Present on Admission:  Hypercalcemia   I have reviewed the medical record, interviewed the patient and family, and examined the patient. The following aspects are pertinent.  History reviewed. No  pertinent past medical history. Social History   Socioeconomic History   Marital status: Unknown    Spouse name: Not on file   Number of children: Not on file   Years of education: Not on file   Highest education level: Not on file  Occupational History   Not on file  Tobacco Use   Smoking status: Not on file   Smokeless tobacco: Not on file  Substance and Sexual Activity   Alcohol use: Not on file   Drug use: Not on file   Sexual activity: Not on file  Other Topics Concern   Not on file  Social History Narrative   Not on file   Social Determinants of Health   Financial Resource Strain: Not on file  Food Insecurity: Not on file  Transportation Needs: Not on file  Physical Activity: Not on file  Stress: Not on file  Social Connections: Not on file   No family history on file. Scheduled Meds:  bictegravir-emtricitabine-tenofovir AF  1 tablet Oral Daily   docusate sodium  100 mg Oral BID   enoxaparin (LOVENOX) injection  40 mg Subcutaneous Q24H   feeding supplement  237 mL Oral BID BM   furosemide  20 mg Oral Daily   polyethylene glycol  17 g Oral Daily   predniSONE  40 mg Oral Q breakfast   Continuous Infusions: PRN Meds:.bisacodyl, HYDROmorphone (DILAUDID) injection, iohexol, traMADol Medications Prior to Admission:  Prior to Admission medications   Medication Sig Start Date End Date Taking? Authorizing Provider  bictegravir-emtricitabine-tenofovir AF (BIKTARVY) 50-200-25 MG TABS tablet Take 1 tablet by mouth daily. 10/09/20  Yes [provider]  dexamethasone (DECADRON) 4 MG tablet Take 4 mg by mouth 2 (two) times daily with a meal.   Yes [provider]  oxyCODONE 10 MG TABS Take 1 tablet (10 mg total) by mouth every 6 (six) hours as needed for severe pain. 12/23/20  Yes Valarie Merino, MD   No Known Allergies Review of Systems Denies pain currently Physical Exam Able to walk to the bathroom using walker Regular work of  breathing S1-S2 Complains of generalized abdominal discomfort No focal deficits, awake alert oriented Has some tattoos both upper and lower extremities, no rashes or ulcers on exposed skin Mood and affect within normal limits  Vital Signs: BP (!) 133/94 (BP Location: Right Arm)   Pulse 96   Temp 98.8 F (37.1 C) (Oral)   Resp 16   Ht '5\' 6"'$  (1.676 m)   Wt 49.9 kg   SpO2 99%   BMI 17.75 kg/m  Pain Scale: 0-10   Pain Score: 10-Worst pain ever   SpO2: SpO2: 99 % O2 Device:SpO2: 99 % O2 Flow Rate: .   IO: Intake/output summary: No intake or output data in the 24 hours ending 01/15/21 1518  LBM:   Baseline Weight: Weight: 49.9 kg Most recent weight: Weight: 49.9 kg     Palliative Assessment/Data:  PPS 50%  Time In:  1420 Time Out:  1520 Time Total:  60  Greater than 50%  of this time was spent counseling and coordinating care related to the above assessment and plan.  Signed by: Loistine Chance, MD   Please contact Palliative Medicine Team phone at [phone] for questions and concerns.  For individual provider: See Shea Evans

## 2021-01-15 NOTE — Progress Notes (Signed)
PROGRESS NOTE    Ashley Ortega  [id number] DOB: 1970/11/05 DOA: 01/14/2021 PCP: Pcp, No     Brief Narrative:  Ashley Ortega is a 50 y.o. 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 Patient was diagnosed with breast cancer in 2020.  Patient was started on chemotherapy but did not tolerate after 2 cycles. Since about 3-4 weeks ago patient has had increasing shortness of breath and back pain, she came to [facility] ED on July 24, CT angiogram negative for PE.  Patient has been following with her oncologist at [facility], and recent CT scan showed patient has had worsening of metastatic lesion diffused distributed in her bones.  And patient declined further chemotherapy.  Oncology recently started her on palliative care with high-dose of narcotics.  New events last 24 hours / Subjective: Patient in tears, complaining of severe back pain.  She tells me that she has lost faith in chemotherapy as she has seen people in the community who die even after chemotherapy treatments.  She states that she did not tolerate chemotherapy after 2 doses and was not interested in resuming treatments.  She follows with outpatient palliative care clinic who manages her pain, but has only been seeing them via phone visits.  She was recently prescribed oxycodone but insurance would only cover 3 days worth of it. Also admits to constipation   Assessment & Plan:   Active Problems:   Hypercalcemia of malignancy   Hypercalcemia   Hypercalcemia of malignancy -Patient given bisphosphonate -Continue IV fluid and Lasix -Trend BMP  Stage IV metastatic bilateral breast cancer with bone pain -Followed by Dr. Merrie Roof at [facility]. Was referred to see radiation oncology in July 2022 but patient declined. Looking at phone records 12/19/2020,  patient declined chemo, immunotherapy, radiation and was subsequently referred to palliative pain management  -Continue prednisone -Palliative care medicine consulted for assistance with pain management and goals of care  Severe protein calorie malnutrition -Dietitian consulted  HIV -Followed by Dr. Otilio Miu at [place]   Hx of DVT in 2020 -Took Xarelto for 1 month then discontinued   DVT prophylaxis: Lovenox SCDs Start: 01/14/21 1821  Code Status:     Code Status Orders  (From admission, onward)           Start     Ordered   01/14/21 1822  Full code  Continuous        01/14/21 1822           Code Status History     This patient has a current code status but no historical code status.      Family Communication: No family at bedside Disposition Plan:  Status is: Inpatient  Remains inpatient appropriate because:Ongoing active pain requiring inpatient pain management  Dispo: The patient is from: Home              Anticipated d/c is to: Home              Patient currently is not medically stable to d/c.   Difficult to place patient No      Consultants:  Palliative care medicine   Antimicrobials:  Anti-infectives (From admission, onward)    Start     Dose/Rate Route Frequency Ordered Stop   01/15/21 1000  bictegravir-emtricitabine-tenofovir AF (BIKTARVY) 50-200-25 MG per tablet  1 tablet        1 tablet Oral Daily 01/14/21 1822          Objective: Vitals:   01/15/21 0800 01/15/21 0845 01/15/21 1000 01/15/21 1209  BP: (!) 141/103  (!) 143/89 (!) 133/94  Pulse: (!) 105 (!) 104 (!) 102 96  Resp: 19 12 (!) 21 16  Temp:    98.8 F (37.1 C)  TempSrc:    Oral  SpO2: 99% 99% 97% 99%  Weight:      Height:       No intake or output data in the 24 hours ending 01/15/21 1307 Filed Weights   01/14/21 1028  Weight: 49.9 kg    Examination:  General exam: Appears uncomfortable, in tears Respiratory system: Clear to  auscultation. Respiratory effort normal. No respiratory distress. No conversational dyspnea.  Cardiovascular system: S1 & S2 heard, tachycardic rate, regular rhythm. No murmurs. No pedal edema. Gastrointestinal system: Abdomen is nondistended, soft and nontender. Normal bowel sounds heard. Central nervous system: Alert and oriented. No focal neurological deficits. Speech clear.  Extremities: Symmetric in appearance  Skin: No rashes, lesions or ulcers on exposed skin  Psychiatry: Mood & affect appropriate.   Data Reviewed: I have personally reviewed following labs and imaging studies  CBC: Recent Labs  Lab 01/14/21 1335  WBC 7.8  NEUTROABS 5.4  HGB 11.3*  HCT 34.0*  MCV 85.6  PLT 0000000   Basic Metabolic Panel: Recent Labs  Lab 01/14/21 1135 01/14/21 2152 01/15/21 0425  NA 134* 135 139  K 4.1 3.3* 3.5  CL 96* 101 107  CO2 '27 27 26  '$ GLUCOSE 98 84 78  BUN '15 13 14  '$ CREATININE 0.74 0.60 0.61  CALCIUM 13.6* 12.2* 12.3*   GFR: Estimated Creatinine Clearance: 68.5 mL/min (by C-G formula based on SCr of 0.61 mg/dL). Liver Function Tests: Recent Labs  Lab 01/14/21 1335  AST 45*  ALT 18  ALKPHOS 143*  BILITOT 1.1  PROT 8.5*  ALBUMIN 3.7   No results for input(s): LIPASE, AMYLASE in the last 168 hours. No results for input(s): AMMONIA in the last 168 hours. Coagulation Profile: No results for input(s): INR, PROTIME in the last 168 hours. Cardiac Enzymes: No results for input(s): CKTOTAL, CKMB, CKMBINDEX, TROPONINI in the last 168 hours. BNP (last 3 results) No results for input(s): PROBNP in the last 8760 hours. HbA1C: No results for input(s): HGBA1C in the last 72 hours. CBG: No results for input(s): GLUCAP in the last 168 hours. Lipid Profile: No results for input(s): CHOL, HDL, LDLCALC, TRIG, CHOLHDL, LDLDIRECT in the last 72 hours. Thyroid Function Tests: No results for input(s): TSH, T4TOTAL, FREET4, T3FREE, THYROIDAB in the last 72 hours. Anemia Panel: No  results for input(s): VITAMINB12, FOLATE, FERRITIN, TIBC, IRON, RETICCTPCT in the last 72 hours. Sepsis Labs: No results for input(s): PROCALCITON, LATICACIDVEN in the last 168 hours.  Recent Results (from the past 240 hour(s))  SARS CORONAVIRUS 2 (TAT 6-24 HRS) Nasopharyngeal Nasopharyngeal Swab     Status: None   Collection Time: 01/14/21  2:27 PM   Specimen: Nasopharyngeal Swab  Result Value Ref Range Status   SARS Coronavirus 2 NEGATIVE NEGATIVE Final    Comment: (NOTE) SARS-CoV-2 target nucleic acids are NOT DETECTED.  The SARS-CoV-2 RNA is generally detectable in upper and lower respiratory specimens during the acute phase of infection. Negative results do not preclude SARS-CoV-2 infection, do not rule out co-infections with other pathogens, and should not be used as the sole  basis for treatment or other patient management decisions. Negative results must be combined with clinical observations, patient history, and epidemiological information. The expected result is Negative.  Fact Sheet for Patients: SugarRoll.be  Fact Sheet for Healthcare Providers: https://www.woods-mathews.com/  This test is not yet approved or cleared by the [place] FDA and  has been authorized for detection and/or diagnosis of SARS-CoV-2 by FDA under an Emergency Use Authorization (EUA). This EUA will remain  in effect (meaning this test can be used) for the duration of the COVID-19 declaration under Se ction 564(b)(1) of the Act, 21 U.S.C. section 360bbb-3(b)(1), unless the authorization is terminated or revoked sooner.  Performed at [facility], [place] [address], [place], [place] [address]       Radiology Studies: DG Chest 2 View  Result Date: 01/14/2021 CLINICAL DATA:  Shortness of breath EXAM: CHEST - 2 VIEW COMPARISON:  CT chest dated December 23, 2020 FINDINGS: Cardiac and mediastinal contours unchanged and within normal limits. Bibasilar  atelectasis. Lungs otherwise clear. No evidence of pleural effusion or pneumothorax. Scattered lytic osseous lesions mild deviation of the left paraspinal line, compatible with known osseous metastatic disease. IMPRESSION: No acute cardiopulmonary disease. Electronically Signed   By: Yetta Glassman M.D.   On: 01/14/2021 12:13   CT Angio Chest PE W/Cm &/Or Wo Cm  Result Date: 01/14/2021 CLINICAL DATA:  Metastatic breast cancer with recent progression. Shortness of breath for 2 days. Chest pain starting last night. EXAM: CT ANGIOGRAPHY CHEST WITH CONTRAST TECHNIQUE: Multidetector CT imaging of the chest was performed using the standard protocol during bolus administration of intravenous contrast. Multiplanar CT image reconstructions and MIPs were obtained to evaluate the vascular anatomy. CONTRAST:  17m OMNIPAQUE IOHEXOL 350 MG/ML SOLN COMPARISON:  12/23/2020 FINDINGS: Cardiovascular: No filling defect is identified in the pulmonary arterial tree to suggest pulmonary embolus. Mild aortic arch atherosclerotic calcification. Mediastinum/Nodes: Right infrahilar node 0.7 cm in short axis on image 69 series 6, stable. Similar sized left infrahilar lymph node. Scattered small left axillary lymph nodes. Lungs/Pleura: Increased subsegmental atelectasis in the posterior basal segment of the right lower lobe. Mild scarring or atelectasis medially in the left lower lobe. Mild lingular scarring. Upper Abdomen: The hepatic metastatic lesions seen on 12/23/2020 are poorly appreciated on today's CTA chest, possibly related to the phase of contrast. A dominant lesion posteriorly in the dome of the right hepatic lobe is only very faintly appreciable on image 97 series 6. Musculoskeletal: Rapidly progressive widespread lytic metastatic disease throughout the chest including multiple lytic rib lesions which have worsened in addition to numerous lesions in the thoracic spine which have enlarged from 12/23/2020. The largest spinal  lesion continues to be the large geographic vertebral lesion at L1 with right posterior element involvement and suspected epidural tumor involvement. An index lesion at T10 measures 1.9 by 1.5 cm on image 72 series 10, previously 1.2 by 1.2 cm. Cutaneous thickening in both breasts is stable. Review of the MIP images confirms the above findings. IMPRESSION: 1. No filling defect is identified in the pulmonary arterial tree to suggest pulmonary embolus. 2. Further progression of lytic metastatic lesions with lytic destructive rib lesions and vertebral lesions. Of the various scattered lytic spine lesions, the largest is at the L1-2 level with the mass involving the vertebral body and right posterior elements and potentially substantially extending into the spinal canal/epidural space. 3. The hepatic metastatic lesions are poorly seen on today's CTA chest, possibly related to the phase of contrast. 4.  Aortic Atherosclerosis (ICD10-I70.0). Electronically Signed   By: Van Clines M.D.   On: 01/14/2021 17:05      Scheduled Meds:  bictegravir-emtricitabine-tenofovir AF  1 tablet Oral Daily   docusate sodium  100 mg Oral BID   feeding supplement  237 mL Oral BID BM   furosemide  20 mg Oral Daily   polyethylene glycol  17 g Oral Daily   predniSONE  40 mg Oral Q breakfast   Continuous Infusions:  sodium chloride 200 mL/hr at 01/15/21 0427     LOS: 1 day      Time spent: 30 minutes   Dessa Phi, DO [facility] 01/15/2021, 1:07 PM   Available via Epic secure chat 7am-7pm After these hours, please refer to coverage provider listed on [email or web address]

## 2021-01-16 DIAGNOSIS — E440 Moderate protein-calorie malnutrition: Secondary | ICD-10-CM | POA: Diagnosis present

## 2021-01-16 DIAGNOSIS — Z515 Encounter for palliative care: Secondary | ICD-10-CM | POA: Diagnosis not present

## 2021-01-16 DIAGNOSIS — Z7189 Other specified counseling: Secondary | ICD-10-CM | POA: Diagnosis not present

## 2021-01-16 DIAGNOSIS — K5900 Constipation, unspecified: Secondary | ICD-10-CM

## 2021-01-16 DIAGNOSIS — R531 Weakness: Secondary | ICD-10-CM

## 2021-01-16 LAB — BASIC METABOLIC PANEL
Anion gap: 7 (ref 5–15)
BUN: 14 mg/dL (ref 6–20)
CO2: 30 mmol/L (ref 22–32)
Calcium: 10.6 mg/dL — ABNORMAL HIGH (ref 8.9–10.3)
Chloride: 102 mmol/L (ref 98–111)
Creatinine, Ser: 0.54 mg/dL (ref 0.44–1.00)
GFR, Estimated: >60 mL/min (ref >60)
Glucose, Bld: 96 mg/dL (ref 70–99)
Potassium: 3.3 mmol/L — ABNORMAL LOW (ref 3.5–5.1)
Sodium: 139 mmol/L (ref 135–145)

## 2021-01-16 LAB — CALCIUM, IONIZED: Calcium, Ionized, Serum: 6.9 mg/dL — ABNORMAL HIGH (ref 4.5–5.6)

## 2021-01-16 LAB — PTH, INTACT AND CALCIUM
Calcium, Total (PTH): 12.3 mg/dL — ABNORMAL HIGH (ref 8.7–10.2)
PTH: 3 pg/mL — ABNORMAL LOW (ref 15–65)

## 2021-01-16 MED ORDER — OXYCODONE HCL 5 MG PO TABS: 5.0000 mg | ORAL_TABLET | ORAL | Status: DC | PRN

## 2021-01-16 MED ORDER — OXYCODONE HCL 5 MG PO TABS
10.0000 mg | ORAL_TABLET | ORAL | Status: DC | PRN
  Administered 2021-01-16 – 2021-01-21 (×17): 10 mg via ORAL
  Filled 2021-01-16 (×18): qty 2

## 2021-01-16 MED ORDER — POLYETHYLENE GLYCOL 3350 17 G PO PACK
17.0000 g | PACK | Freq: Two times a day (BID) | ORAL | Status: DC
  Administered 2021-01-16 – 2021-01-21 (×10): 17 g via ORAL
  Filled 2021-01-16 (×10): qty 1

## 2021-01-16 MED ORDER — POTASSIUM CHLORIDE CRYS ER 10 MEQ PO TBCR
30.0000 meq | EXTENDED_RELEASE_TABLET | ORAL | Status: AC
  Administered 2021-01-16 (×2): 30 meq via ORAL
  Filled 2021-01-16 (×2): qty 3

## 2021-01-16 MED ORDER — SENNOSIDES-DOCUSATE SODIUM 8.6-50 MG PO TABS
2.0000 | ORAL_TABLET | Freq: Two times a day (BID) | ORAL | Status: DC
  Administered 2021-01-16 – 2021-01-21 (×10): 2 via ORAL
  Filled 2021-01-16 (×10): qty 2

## 2021-01-16 NOTE — Progress Notes (Signed)
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  Initially diagnosed with breast cancer in 2020, was started on chemotherapy but did not tolerate after 2 cycles.  Patient has been following with her oncologist at [facility] and recent CT scan showed patient had worsening of her metastatic lesions distributed in her bones and patient declined further chemotherapy.  Oncology recently started her on palliative care with high-dose narcotics.  Follows with outpatient palliative care clinic who manages her pain, but only has been seeing them via phone visits.  Roughly 4 weeks ago, patient had worsening shortness of breath and back pain, initially presented to [facility] ED on July 24 with CT angiogram negative for PE.  EDP consulted [facility] for further evaluation and treatment of intractable pain of malignancy, hypercalcemia and constipation.   Assessment & Plan:   Principal Problem:   Hypercalcemia of malignancy Active Problems:   Hypercalcemia   Malnutrition of moderate degree   Hypercalcemia of malignancy Upon presentation, patient's calcium noted to be elevated at 12.3.  Patient received dose of Zometa 4 mg IV on 8/16. --Ca 12.3>10.6 --Furosemide 20 mg p.o. daily --Encourage increase oral intake --Repeat BMP in a.m.  Stage IV metastatic breast cancer Followed by Dr. Merrie Roof at [facility]. Was referred to see radiation oncology in July 2022 but patient declined. Looking at phone records 12/19/2020, patient declined chemo, immunotherapy, radiation and was subsequently referred to palliative pain management   --Continue prednisone 40 mg p.o. daily --Patient request referral to [facility], ambulatory referral placed  Cancer associated pain --Palliative care following, appreciate assistance --Oxycodone 10 mg every 4 hours as needed moderate pain --Dilaudid 1 mg IV every 3 hours as needed severe pain  Constipation --Colace 100 mg p.o. twice daily --MiraLAX twice daily --Bisacodyl suppository 10 mg daily as needed moderate constipation  HIV Followed by infectious disease, Dr. Otilio Miu at [facility] hospital. --[place]  Hx DVT 2020 Patient reports treated with Xarelto for 1 month then discontinued.  Moderate protein calorie malnutrition Body mass index is 17.75 kg/m. Nutrition Status: Nutrition Problem: Moderate Malnutrition Etiology: chronic illness, cancer and cancer related treatments Signs/Symptoms: moderate fat depletion, mild muscle depletion, percent weight loss, energy intake < or equal to 75% for > or equal to 1 month Interventions: Ensure Enlive (each supplement provides 350kcal and 20 grams of protein), MVI -- Dietitian following, appreciate assistance.  Continue supplements, encourage increase oral intake    DVT prophylaxis: enoxaparin (LOVENOX) injection 40 mg Start: 01/15/21 1400 SCDs Start: 01/14/21 1821   Code Status: Full Code Family Communication: No family present at bedside this morning  Disposition Plan:  Level of care: Med-Surg Status is: Inpatient  Remains inpatient appropriate because:Ongoing active pain requiring inpatient pain management, Unsafe d/c plan, and Inpatient level of care appropriate due to severity of illness  Dispo: The patient is from: Home              Anticipated d/c is to: Home              Patient currently is not medically stable to d/c.   Difficult to place patient  No   Consultants:  Palliative care  Procedures:  None  Antimicrobials:  None   Subjective: Patient seen examined at bedside,  resting comfortably.  States oral tramadol not effective in her pain control.  Has been utilizing IV Dilaudid past 24 hours.  Seen by palliative care this morning.  Also concerned about her constipation and requesting referral to [facility] for continued evaluation and treatment of her breast cancer.  No other complaints or concerns at this time.  Denies headache, no visual changes, no chest pain, no shortness of breath, no abdominal pain, no weakness, no fatigue, no paresthesias.  No acute events overnight per nursing.  Objective: Vitals:   01/15/21 1608 01/15/21 2019 01/15/21 2357 01/16/21 0527  BP: (!) 148/87 127/85 114/84 116/77  Pulse: 87 91 87 89  Resp: '15 18 20 20  '$ Temp: 98.4 F (36.9 C) 98.9 F (37.2 C) 98.1 F (36.7 C) 98.4 F (36.9 C)  TempSrc: Oral Oral Oral Oral  SpO2: 99% 100% 100% 100%  Weight:      Height:        Intake/Output Summary (Last 24 hours) at 01/16/2021 1451 Last data filed at 01/16/2021 0500 Gross per 24 hour  Intake 2918.85 ml  Output 450 ml  Net 2468.85 ml   Filed Weights   01/14/21 1028  Weight: 49.9 kg    Examination:  General exam: Appears calm and comfortable, chronically ill in appearance with fat/muscle wasting noted on exam Respiratory system: Clear to auscultation. Respiratory effort normal.  On room air Cardiovascular system: S1 & S2 heard, RRR. No JVD, murmurs, rubs, gallops or clicks. No pedal edema. Gastrointestinal system: Abdomen is nondistended, soft and nontender. No organomegaly or masses felt. Normal bowel sounds heard. Central nervous system: Alert and oriented. No focal neurological deficits. Extremities: Symmetric 5 x 5 power. Skin: No rashes, lesions or ulcers Psychiatry: Judgement and insight appear normal. Mood & affect appropriate.     Data Reviewed: I have personally reviewed following labs and imaging studies  CBC: Recent Labs  Lab 01/14/21 1335  WBC 7.8  NEUTROABS 5.4  HGB 11.3*  HCT 34.0*  MCV  85.6  PLT 0000000   Basic Metabolic Panel: Recent Labs  Lab 01/14/21 1135 01/14/21 2152 01/15/21 0425 01/16/21 0513  NA 134* 135 139 139  K 4.1 3.3* 3.5 3.3*  CL 96* 101 107 102  CO2 '27 27 26 30  '$ GLUCOSE 98 84 78 96  BUN '15 13 14 14  '$ CREATININE 0.74 0.60 0.61 0.54  CALCIUM 13.6* 12.2* 12.3*  12.3* 10.6*   GFR: Estimated Creatinine Clearance: 68.5 mL/min (by C-G formula based on SCr of 0.54 mg/dL). Liver Function Tests: Recent Labs  Lab 01/14/21 1335  AST 45*  ALT 18  ALKPHOS 143*  BILITOT 1.1  PROT 8.5*  ALBUMIN 3.7   No results for input(s): LIPASE, AMYLASE in the last 168 hours. No results for input(s): AMMONIA in the last 168 hours. Coagulation Profile: No results for input(s): INR, PROTIME in the last 168 hours. Cardiac Enzymes: No results for input(s): CKTOTAL, CKMB, CKMBINDEX, TROPONINI in the last 168 hours. BNP (last 3 results) No results for input(s): PROBNP in the last 8760 hours. HbA1C: No results for input(s): HGBA1C in the last 72 hours. CBG: No results for input(s): GLUCAP in the last 168 hours. Lipid Profile: No results for input(s): CHOL, HDL, LDLCALC, TRIG, CHOLHDL, LDLDIRECT in the last 72 hours. Thyroid Function Tests: No results for input(s): TSH, T4TOTAL, FREET4, T3FREE, THYROIDAB  in the last 72 hours. Anemia Panel: No results for input(s): VITAMINB12, FOLATE, FERRITIN, TIBC, IRON, RETICCTPCT in the last 72 hours. Sepsis Labs: No results for input(s): PROCALCITON, LATICACIDVEN in the last 168 hours.  Recent Results (from the past 240 hour(s))  SARS CORONAVIRUS 2 (TAT 6-24 HRS) Nasopharyngeal Nasopharyngeal Swab     Status: None   Collection Time: 01/14/21  2:27 PM   Specimen: Nasopharyngeal Swab  Result Value Ref Range Status   SARS Coronavirus 2 NEGATIVE NEGATIVE Final    Comment: (NOTE) SARS-CoV-2 target nucleic acids are NOT DETECTED.  The SARS-CoV-2 RNA is generally detectable in upper and lower respiratory specimens during the  acute phase of infection. Negative results do not preclude SARS-CoV-2 infection, do not rule out co-infections with other pathogens, and should not be used as the sole basis for treatment or other patient management decisions. Negative results must be combined with clinical observations, patient history, and epidemiological information. The expected result is Negative.  Fact Sheet for Patients: SugarRoll.be  Fact Sheet for Healthcare Providers: https://www.woods-mathews.com/  This test is not yet approved or cleared by the [place] FDA and  has been authorized for detection and/or diagnosis of SARS-CoV-2 by FDA under an Emergency Use Authorization (EUA). This EUA will remain  in effect (meaning this test can be used) for the duration of the COVID-19 declaration under Se ction 564(b)(1) of the Act, 21 U.S.C. section 360bbb-3(b)(1), unless the authorization is terminated or revoked sooner.  Performed at [facility], [place] [address], [place], [place] [address]          Radiology Studies: CT Angio Chest PE W/Cm &/Or Wo Cm  Result Date: 01/14/2021 CLINICAL DATA:  Metastatic breast cancer with recent progression. Shortness of breath for 2 days. Chest pain starting last night. EXAM: CT ANGIOGRAPHY CHEST WITH CONTRAST TECHNIQUE: Multidetector CT imaging of the chest was performed using the standard protocol during bolus administration of intravenous contrast. Multiplanar CT image reconstructions and MIPs were obtained to evaluate the vascular anatomy. CONTRAST:  15m OMNIPAQUE IOHEXOL 350 MG/ML SOLN COMPARISON:  12/23/2020 FINDINGS: Cardiovascular: No filling defect is identified in the pulmonary arterial tree to suggest pulmonary embolus. Mild aortic arch atherosclerotic calcification. Mediastinum/Nodes: Right infrahilar node 0.7 cm in short axis on image 69 series 6, stable. Similar sized left infrahilar lymph node. Scattered small left  axillary lymph nodes. Lungs/Pleura: Increased subsegmental atelectasis in the posterior basal segment of the right lower lobe. Mild scarring or atelectasis medially in the left lower lobe. Mild lingular scarring. Upper Abdomen: The hepatic metastatic lesions seen on 12/23/2020 are poorly appreciated on today's CTA chest, possibly related to the phase of contrast. A dominant lesion posteriorly in the dome of the right hepatic lobe is only very faintly appreciable on image 97 series 6. Musculoskeletal: Rapidly progressive widespread lytic metastatic disease throughout the chest including multiple lytic rib lesions which have worsened in addition to numerous lesions in the thoracic spine which have enlarged from 12/23/2020. The largest spinal lesion continues to be the large geographic vertebral lesion at L1 with right posterior element involvement and suspected epidural tumor involvement. An index lesion at T10 measures 1.9 by 1.5 cm on image 72 series 10, previously 1.2 by 1.2 cm. Cutaneous thickening in both breasts is stable. Review of the MIP images confirms the above findings. IMPRESSION: 1. No filling defect is identified in the pulmonary arterial tree to suggest pulmonary embolus. 2. Further progression of lytic metastatic lesions with lytic destructive rib lesions and  vertebral lesions. Of the various scattered lytic spine lesions, the largest is at the L1-2 level with the mass involving the vertebral body and right posterior elements and potentially substantially extending into the spinal canal/epidural space. 3. The hepatic metastatic lesions are poorly seen on today's CTA chest, possibly related to the phase of contrast. 4.  Aortic Atherosclerosis (ICD10-I70.0). Electronically Signed   By: Van Clines M.D.   On: 01/14/2021 17:05        Scheduled Meds:  bictegravir-emtricitabine-tenofovir AF  1 tablet Oral Daily   docusate sodium  100 mg Oral BID   enoxaparin (LOVENOX) injection  40 mg  Subcutaneous Q24H   feeding supplement  237 mL Oral BID BM   furosemide  20 mg Oral Daily   polyethylene glycol  17 g Oral BID   predniSONE  40 mg Oral Q breakfast   Continuous Infusions:   LOS: 2 days    Time spent: 51 minutes spent on chart review, discussion with nursing staff, consultants, updating family and interview/physical exam; more than 50% of that time was spent in counseling and/or coordination of care.    Sandhya Denherder J [place] DO [facility] Available via Epic secure chat 7am-7pm After these hours, please refer to coverage provider listed on [email or web address] 01/16/2021, 2:51 PM

## 2021-01-16 NOTE — Progress Notes (Signed)
Initial Nutrition Assessment  DOCUMENTATION CODES:   Non-severe (moderate) malnutrition in context of chronic illness, Underweight  INTERVENTION:   -Ensure Plus PO BID, each provides 350 kcals and 13g protein  NUTRITION DIAGNOSIS:   Moderate Malnutrition related to chronic illness, cancer and cancer related treatments as evidenced by moderate fat depletion, mild muscle depletion, percent weight loss, energy intake < or equal to 75% for > or equal to 1 month.  GOAL:   Patient will meet greater than or equal to 90% of their needs  MONITOR:   PO intake, Supplement acceptance, Labs, Weight trends, I & O's  REASON FOR ASSESSMENT:   Consult Assessment of nutrition requirement/status  ASSESSMENT:   50 y.o. 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 Patient was diagnosed with breast cancer in 2020.  Patient was started on chemotherapy but did not tolerate after 2 cycles.  Patient states she has been in a lot of pain recently so this had impacted her appetite. She ate a good amount of breakfast this morning. Drinking an Ensure at bedside as well. States she has a case at home of Ensure to drink.  Reviewed good protein options to order from the menu with patient.  Per weight records, pt has lost 9 lbs since 7/24 (7% wt loss x 4 weeks, significant for time frame).   Medications: Colace, Lasix, Miralax  Labs reviewed:  Low K  NUTRITION - FOCUSED PHYSICAL EXAM:  Flowsheet Row Most Recent Value  Orbital Region Mild depletion  Upper Arm Region Moderate depletion  Thoracic and Lumbar Region Unable to assess  Buccal Region Mild depletion  Temple Region Mild depletion  Clavicle Bone Region Mild depletion  Clavicle and Acromion Bone Region No depletion  Scapular Bone Region No depletion  Dorsal Hand Mild  depletion  Patellar Region Unable to assess  Anterior Thigh Region Unable to assess  Posterior Calf Region Unable to assess  Edema (RD Assessment) None       Diet Order:   Diet Order             Diet regular Room service appropriate? Yes; Fluid consistency: Thin  Diet effective now                   EDUCATION NEEDS:   Education needs have been addressed  Skin:  Skin Assessment: Reviewed RN Assessment  Last BM:  8/11  Height:   Ht Readings from Last 1 Encounters:  01/14/21 '5\' 6"'$  (1.676 m)    Weight:   Wt Readings from Last 1 Encounters:  01/14/21 49.9 kg    BMI:  Body mass index is 17.75 kg/m.  Estimated Nutritional Needs:   Kcal:  1600-1800  Protein:  70-85g  Fluid:  1.8L/day   Clayton Bibles, MS, RD, LDN Inpatient Clinical Dietitian Contact information available via Amion

## 2021-01-16 NOTE — Progress Notes (Signed)
   01/16/21 1937  Assess: MEWS Score  Temp 99.3 F (37.4 C)  BP 132/90  Pulse Rate (!) 124  Resp 20  SpO2 100 %  Assess: MEWS Score  MEWS Temp 0  MEWS Systolic 0  MEWS Pulse 2  MEWS RR 0  MEWS LOC 0  MEWS Score 2  MEWS Score Color Yellow  Assess: if the MEWS score is Yellow or Red  Were vital signs taken at a resting state? Yes  Focused Assessment No change from prior assessment  Does the patient meet 2 or more of the SIRS criteria? No  MEWS guidelines implemented *See Row Information* Yes  Take Vital Signs  Increase Vital Sign Frequency  Yellow: Q 2hr X 2 then Q 4hr X 2, if remains yellow, continue Q 4hrs  Escalate  MEWS: Escalate Yellow: discuss with charge nurse/RN and consider discussing with provider and RRT  Notify: Charge Nurse/RN  Name of Charge Nurse/RN Notified Kathyrn Lass RN  Date Charge Nurse/RN Notified 01/16/21  Time Charge Nurse/RN Notified 1940  Document  Patient Outcome Other (Comment) (pt is stable, no interventions needed)  Progress note created (see row info) Yes  Assess: SIRS CRITERIA  SIRS Temperature  0  SIRS Pulse 1  SIRS Respirations  0  SIRS WBC 0  SIRS Score Sum  1   MEWS implemented due to elevated heart rate. Patient is stable, alert and oriented x 4, no change in patient. Tom Warehouse manager notified. MEWS guidelines started and will continue to monitor patient.

## 2021-01-16 NOTE — Progress Notes (Signed)
Daily Progress Note   Patient Name: Ashley Ortega       Date: 01/16/2021 DOB: Jun 08, 1970  Age: 50 y.o. MRN#: [id number] Attending Physician: [place] Eric J, DO Primary Care Physician: Pcp, No Admit Date: 01/14/2021  Reason for Consultation/Follow-up: Establishing goals of care  Subjective:  Awake alert, complains of constipation, pain is reasonably well controlled, pain history noted, medications noted. Patient required 4 doses of 1 mg each IV Dilaudid in the past 24 hours.   Length of Stay: 2  Current Medications: Scheduled Meds:   bictegravir-emtricitabine-tenofovir AF  1 tablet Oral Daily   docusate sodium  100 mg Oral BID   enoxaparin (LOVENOX) injection  40 mg Subcutaneous Q24H   feeding supplement  237 mL Oral BID BM   furosemide  20 mg Oral Daily   polyethylene glycol  17 g Oral Daily   predniSONE  40 mg Oral Q breakfast    Continuous Infusions:   PRN Meds: bisacodyl, HYDROmorphone (DILAUDID) injection, iohexol, oxyCODONE  Physical Exam         Awake alert  No distress Regular work of breathing S 1 S 2  Abdomen is not distended Patient has no edema No focal deficits.   Vital Signs: BP 116/77   Pulse 89   Temp 98.4 F (36.9 C) (Oral)   Resp 20   Ht '5\' 6"'$  (1.676 m)   Wt 49.9 kg   SpO2 100%   BMI 17.75 kg/m  SpO2: SpO2: 100 % O2 Device: O2 Device: Room Air O2 Flow Rate:    Intake/output summary:  Intake/Output Summary (Last 24 hours) at 01/16/2021 1356 Last data filed at 01/16/2021 0500 Gross per 24 hour  Intake 2918.85 ml  Output 450 ml  Net 2468.85 ml   LBM: Last BM Date: 01/16/21 Baseline Weight: Weight: 49.9 kg Most recent weight: Weight: 49.9 kg      PPS 50% Palliative Assessment/Data:      Patient Active Problem List   Diagnosis Date  Noted   Hypercalcemia of malignancy 01/14/2021   Hypercalcemia 01/14/2021    Palliative Care Assessment & Plan   Patient Profile:  50 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  She has past medical history significant for stage IV metastatic breast cancer to thoracic and lumbar  vertebrae, HIV on Haart, and patient's oncology care is over at [facility].  It is reported that patient's recent CT scan apparently showed worsening of metastatic diffuse lesions in her bones and the patient declined further chemotherapy.   Patient presented to [facility] emergency department and has been admitted to hospital medicine service.  She has been admitted for hypercalcemia of malignancy, stage IV metastatic bilateral breast cancer with bone pain.   Palliative medicine consulted for pain management and goals of care discussions.    Assessment:  Cancer related pain Constipation Generalized weakness Progressive malignancy  Recommendations/Plan: Spiritual care consult to complete HCPOA and other advance directives as per patient wishes. Continue current mode of care.  Adjust Miralax to BID and monitor, agree with the rest of bowel regimen.  Patient has medical background, states she understands the severity of her illness, however wishes to continue with full code and wishes to establish with [facility] oncologist going forward.     Goals of Care and Additional Recommendations: Limitations on Scope of Treatment: Full Scope Treatment  Code Status:    Code Status Orders  (From admission, onward)           Start     Ordered   01/14/21 1822  Full code  Continuous        01/14/21 1822           Code Status History     This patient has a current code status but no historical code status.       Prognosis:  Guarded   Discharge Planning: To Be Determined  Care plan was discussed with   patient.   Thank you for allowing the Palliative Medicine Team to assist in the care of this patient.   Time In: 1300 Time Out: 1325 Total Time 25 Prolonged Time Billed No       Greater than 50%  of this time was spent counseling and coordinating care related to the above assessment and plan.  Loistine Chance, MD  Please contact Palliative Medicine Team phone at [phone] for questions and concerns.

## 2021-01-16 NOTE — Progress Notes (Signed)
Chaplain engaged in an initial visit with [facility].  Chaplain went over Advanced Directive paperwork.  Ashley Ortega is clear that she wants to assign her sister as her Healthcare POA.  She wanted to spend time talking with her sister about her wants and needs for her medical care before doing the paperwork.  Sister was on the phone with Ashley Ortega having that conversation.  Ashley Ortega also shared that her dad passed a month ago.  She became emotional speaking about him which also helped her recognize how important advanced care planning is.  Chaplain offered listening, support, and presence.    01/16/21 1400  Clinical Encounter Type  Visited With Patient  Visit Type Initial;Social support  Referral From Patient;Palliative care team  Consult/Referral To Chaplain  Stress Factors  Patient Stress Factors Major life changes

## 2021-01-17 DIAGNOSIS — R531 Weakness: Secondary | ICD-10-CM | POA: Diagnosis not present

## 2021-01-17 DIAGNOSIS — Z7189 Other specified counseling: Secondary | ICD-10-CM | POA: Diagnosis not present

## 2021-01-17 DIAGNOSIS — Z515 Encounter for palliative care: Secondary | ICD-10-CM | POA: Diagnosis not present

## 2021-01-17 LAB — BASIC METABOLIC PANEL
Anion gap: 9 (ref 5–15)
Anion gap: 9 (ref 5–15)
BUN: 14 mg/dL (ref 6–20)
BUN: 10 mg/dL (ref 6–20)
CO2: 30 mmol/L (ref 22–32)
CO2: 28 mmol/L (ref 22–32)
Calcium: 10.3 mg/dL (ref 8.9–10.3)
Calcium: 9.1 mg/dL (ref 8.9–10.3)
Chloride: 100 mmol/L (ref 98–111)
Chloride: 99 mmol/L (ref 98–111)
Creatinine, Ser: 0.68 mg/dL (ref 0.44–1.00)
Creatinine, Ser: 0.62 mg/dL (ref 0.44–1.00)
GFR, Estimated: >60 mL/min (ref >60)
GFR, Estimated: >60 mL/min (ref >60)
Glucose, Bld: 100 mg/dL — ABNORMAL HIGH (ref 70–99)
Glucose, Bld: 103 mg/dL — ABNORMAL HIGH (ref 70–99)
Potassium: 2.9 mmol/L — ABNORMAL LOW (ref 3.5–5.1)
Potassium: 3.8 mmol/L (ref 3.5–5.1)
Sodium: 139 mmol/L (ref 135–145)
Sodium: 136 mmol/L (ref 135–145)

## 2021-01-17 LAB — MAGNESIUM: Magnesium: 1.7 mg/dL (ref 1.7–2.4)

## 2021-01-17 MED ORDER — POTASSIUM CHLORIDE 10 MEQ/100ML IV SOLN
INTRAVENOUS | Status: AC
  Administered 2021-01-17: 10 meq via INTRAVENOUS
  Filled 2021-01-17: qty 100

## 2021-01-17 MED ORDER — POTASSIUM CHLORIDE CRYS ER 10 MEQ PO TBCR
40.0000 meq | EXTENDED_RELEASE_TABLET | Freq: Once | ORAL | Status: AC
  Administered 2021-01-17: 40 meq via ORAL
  Filled 2021-01-17: qty 4

## 2021-01-17 MED ORDER — POTASSIUM CHLORIDE 10 MEQ/100ML IV SOLN
10.0000 meq | INTRAVENOUS | Status: AC
  Administered 2021-01-17 (×3): 10 meq via INTRAVENOUS
  Filled 2021-01-17 (×3): qty 100

## 2021-01-17 MED ORDER — SORBITOL 70 % SOLN
960.0000 mL | TOPICAL_OIL | Freq: Once | ORAL | Status: AC
  Administered 2021-01-17: 960 mL via RECTAL
  Filled 2021-01-17: qty 473

## 2021-01-17 MED ORDER — MAGNESIUM SULFATE 2 GM/50ML IV SOLN
2.0000 g | Freq: Once | INTRAVENOUS | Status: AC
  Administered 2021-01-17: 2 g via INTRAVENOUS
  Filled 2021-01-17: qty 50

## 2021-01-17 MED ORDER — NALOXEGOL OXALATE 25 MG PO TABS
25.0000 mg | ORAL_TABLET | Freq: Once | ORAL | Status: AC
  Administered 2021-01-17: 25 mg via ORAL
  Filled 2021-01-17: qty 1

## 2021-01-17 MED ORDER — ACETAMINOPHEN 325 MG PO TABS
650.0000 mg | ORAL_TABLET | Freq: Four times a day (QID) | ORAL | Status: AC | PRN
  Administered 2021-01-18: 650 mg via ORAL
  Filled 2021-01-17 (×3): qty 2

## 2021-01-17 MED ORDER — ACETAMINOPHEN 650 MG RE SUPP: 650.0000 mg | Freq: Four times a day (QID) | RECTAL | Status: AC | PRN

## 2021-01-17 MED ORDER — BISACODYL 10 MG RE SUPP
10.0000 mg | Freq: Once | RECTAL | Status: AC
  Administered 2021-01-17: 10 mg via RECTAL
  Filled 2021-01-17: qty 1

## 2021-01-17 NOTE — Progress Notes (Signed)
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  Initially diagnosed with breast cancer in 2020, was started on chemotherapy but did not tolerate after 2 cycles.  Patient has been following with her oncologist at [facility] and recent CT scan showed patient had worsening of her metastatic lesions distributed in her bones and patient declined further chemotherapy.  Oncology recently started her on palliative care with high-dose narcotics.  Follows with outpatient palliative care clinic who manages her pain, but only has been seeing them via phone visits.  Roughly 4 weeks ago, patient had worsening shortness of breath and back pain, initially presented to [facility] ED on July 24 with CT angiogram negative for PE.  EDP consulted [facility] for further evaluation and treatment of intractable pain of malignancy, hypercalcemia and constipation.   Assessment & Plan:   Principal Problem:   Hypercalcemia of malignancy Active Problems:   Hypercalcemia   Malnutrition of moderate degree   Hypercalcemia of malignancy Upon presentation, patient's calcium noted to be elevated at 12.3.  Patient received dose of Zometa 4 mg IV on 8/16. --Ca 12.3>10.6>10.3 --Furosemide 20 mg p.o. daily --Encourage increase oral intake --Repeat BMP in a.m.  Stage IV metastatic breast cancer Followed by Dr. Merrie Roof at [facility]. Was referred to see radiation oncology in July 2022 but patient declined. Looking at phone records 12/19/2020, patient declined chemo, immunotherapy, radiation and was subsequently referred to palliative pain  management  --Continue prednisone 40 mg p.o. daily --Appointment scheduled at [facility] with Dr. Jana Hakim on 8/23 at 4 PM  Cancer associated pain --Palliative care following, appreciate assistance --Oxycodone 10 mg q4h PRN moderate pain --Dilaudid 1 mg IV q3h PRN severe pain  Constipation --Senokot-as 2 tablets twice daily --MiraLAX twice daily --Movantik 25 mg p.o. x1 today --SMOG enema today --Bisacodyl suppository 10 mg daily as needed moderate constipation  HIV Followed by infectious disease, Dr. Otilio Miu at [facility] hospital. --[place]  Hx DVT 2020 Patient reports treated with Xarelto for 1 month then discontinued.  Moderate protein calorie malnutrition Body mass index is 17.75 kg/m. Nutrition Status: Nutrition Problem: Moderate Malnutrition Etiology: chronic illness, cancer and cancer related treatments Signs/Symptoms: moderate fat depletion, mild muscle depletion, percent weight loss, energy intake < or equal to 75% for > or equal to 1 month Interventions: Ensure Enlive (each supplement provides 350kcal and 20 grams of protein), MVI -- Dietitian following, appreciate assistance.  Continue supplements, encourage increase oral intake    DVT prophylaxis: enoxaparin (LOVENOX) injection 40 mg Start: 01/15/21 1400 SCDs Start: 01/14/21 1821   Code Status: Full Code Family Communication: No family present at bedside this morning  Disposition Plan:  Level of care: Med-Surg Status is: Inpatient  Remains inpatient appropriate because:Ongoing active pain requiring inpatient pain management, Unsafe d/c plan, and Inpatient level of care appropriate due to severity of illness  Dispo: The patient is from: Home              Anticipated d/c is to: Home              Patient currently is not medically stable to d/c.  Difficult to place patient No   Consultants:  Palliative care  Procedures:  None  Antimicrobials:   None   Subjective: Patient seen examined at bedside, complaining of continued abdominal pain/constipation.  Still no bowel movement.  Wishes to try alternative therapies to include suppository.  Feels over all ill.  Seen by palliative care today with recommendations of outpatient palliative care to follow on discharge.  Has been set up with outpatient appointment with the [facility] with Dr. Jana Hakim on 8/23.  No other questions or concerns at this time. Denies headache, no visual changes, no chest pain, no shortness of breath, no weakness, no fatigue, no paresthesias.  No acute events overnight per nursing.  Objective: Vitals:   01/17/21 0324 01/17/21 0528 01/17/21 0737 01/17/21 1303  BP: 115/81 125/87 (!) 141/92 135/89  Pulse: 74 92 94 (!) 104  Resp: '20 20 20 20  '$ Temp: 97.9 F (36.6 C) 97.9 F (36.6 C) 98.2 F (36.8 C) 99.8 F (37.7 C)  TempSrc: Oral Oral Axillary   SpO2: 98% 99% 100% 99%  Weight:      Height:       No intake or output data in the 24 hours ending 01/17/21 1401  Filed Weights   01/14/21 1028  Weight: 49.9 kg    Examination:  General exam: Appears calm and comfortable, chronically ill in appearance with fat/muscle wasting noted on exam Respiratory system: Clear to auscultation. Respiratory effort normal.  On room air Cardiovascular system: S1 & S2 heard, RRR. No JVD, murmurs, rubs, gallops or clicks. No pedal edema. Gastrointestinal system: Abdomen is nondistended, soft and mild generalized tenderness. No organomegaly or masses felt. Normal bowel sounds heard. Central nervous system: Alert and oriented. No focal neurological deficits. Extremities: Symmetric 5 x 5 power. Skin: No rashes, lesions or ulcers Psychiatry: Judgement and insight appear normal. Mood & affect appropriate.     Data Reviewed: I have personally reviewed following labs and imaging studies  CBC: Recent Labs  Lab 01/14/21 1335  WBC 7.8  NEUTROABS 5.4  HGB 11.3*  HCT 34.0*   MCV 85.6  PLT 0000000   Basic Metabolic Panel: Recent Labs  Lab 01/14/21 1135 01/14/21 2152 01/15/21 0425 01/16/21 0513 01/17/21 0945  NA 134* 135 139 139 139  K 4.1 3.3* 3.5 3.3* 2.9*  CL 96* 101 107 102 100  CO2 '27 27 26 30 30  '$ GLUCOSE 98 84 78 96 100*  BUN '15 13 14 14 14  '$ CREATININE 0.74 0.60 0.61 0.54 0.68  CALCIUM 13.6* 12.2* 12.3*  12.3* 10.6* 10.3  MG  --   --   --   --  1.7   GFR: Estimated Creatinine Clearance: 68.5 mL/min (by C-G formula based on SCr of 0.68 mg/dL). Liver Function Tests: Recent Labs  Lab 01/14/21 1335  AST 45*  ALT 18  ALKPHOS 143*  BILITOT 1.1  PROT 8.5*  ALBUMIN 3.7   No results for input(s): LIPASE, AMYLASE in the last 168 hours. No results for input(s): AMMONIA in the last 168 hours. Coagulation Profile: No results for input(s): INR, PROTIME in the last 168 hours. Cardiac Enzymes: No results for input(s): CKTOTAL, CKMB, CKMBINDEX, TROPONINI in the last 168 hours. BNP (last 3 results) No results for input(s): PROBNP in the last 8760 hours. HbA1C: No results for input(s): HGBA1C in the last 72 hours. CBG: No results for input(s): GLUCAP in the last 168 hours. Lipid Profile: No results for input(s): CHOL, HDL, LDLCALC, TRIG, CHOLHDL, LDLDIRECT in the  last 72 hours. Thyroid Function Tests: No results for input(s): TSH, T4TOTAL, FREET4, T3FREE, THYROIDAB in the last 72 hours. Anemia Panel: No results for input(s): VITAMINB12, FOLATE, FERRITIN, TIBC, IRON, RETICCTPCT in the last 72 hours. Sepsis Labs: No results for input(s): PROCALCITON, LATICACIDVEN in the last 168 hours.  Recent Results (from the past 240 hour(s))  SARS CORONAVIRUS 2 (TAT 6-24 HRS) Nasopharyngeal Nasopharyngeal Swab     Status: None   Collection Time: 01/14/21  2:27 PM   Specimen: Nasopharyngeal Swab  Result Value Ref Range Status   SARS Coronavirus 2 NEGATIVE NEGATIVE Final    Comment: (NOTE) SARS-CoV-2 target nucleic acids are NOT DETECTED.  The SARS-CoV-2  RNA is generally detectable in upper and lower respiratory specimens during the acute phase of infection. Negative results do not preclude SARS-CoV-2 infection, do not rule out co-infections with other pathogens, and should not be used as the sole basis for treatment or other patient management decisions. Negative results must be combined with clinical observations, patient history, and epidemiological information. The expected result is Negative.  Fact Sheet for Patients: SugarRoll.be  Fact Sheet for Healthcare Providers: https://www.woods-mathews.com/  This test is not yet approved or cleared by the [place] FDA and  has been authorized for detection and/or diagnosis of SARS-CoV-2 by FDA under an Emergency Use Authorization (EUA). This EUA will remain  in effect (meaning this test can be used) for the duration of the COVID-19 declaration under Se ction 564(b)(1) of the Act, 21 U.S.C. section 360bbb-3(b)(1), unless the authorization is terminated or revoked sooner.  Performed at [facility], [place] [address] [place],  [address]          Radiology Studies: No results found.      Scheduled Meds:  bictegravir-emtricitabine-tenofovir AF  1 tablet Oral Daily   enoxaparin (LOVENOX) injection  40 mg Subcutaneous Q24H   feeding supplement  237 mL Oral BID BM   furosemide  20 mg Oral Daily   polyethylene glycol  17 g Oral BID   potassium chloride  40 mEq Oral Once   predniSONE  40 mg Oral Q breakfast   senna-docusate  2 tablet Oral BID   sorbitol, milk of mag, mineral oil, glycerin (SMOG) enema  960 mL Rectal Once   Continuous Infusions:  magnesium sulfate bolus IVPB     potassium chloride       LOS: 3 days    Time spent: 46 minutes spent on chart review, discussion with nursing staff, consultants, updating family and interview/physical exam; more than 50% of that time was spent in counseling and/or  coordination of care.    Tyquavious Gamel J [place] DO [facility] Available via Epic secure chat 7am-7pm After these hours, please refer to coverage provider listed on [email or web address] 01/17/2021, 2:01 PM

## 2021-01-17 NOTE — Progress Notes (Signed)
Daily Progress Note   Patient Name: Roslyn Else       Date: 01/17/2021 DOB: Jul 04, 1970  Age: 50 y.o. MRN#: [id number] Attending Physician: [place] Eric J, DO Primary Care Physician: Pcp, No Admit Date: 01/14/2021  Reason for Consultation/Follow-up: Establishing goals of care  Subjective:  Resting in bed, with covers over her head, denies pain. Chart reviewed, see below.     Length of Stay: 3  Current Medications: Scheduled Meds:  . bictegravir-emtricitabine-tenofovir AF  1 tablet Oral Daily  . enoxaparin (LOVENOX) injection  40 mg Subcutaneous Q24H  . feeding supplement  237 mL Oral BID BM  . furosemide  20 mg Oral Daily  . polyethylene glycol  17 g Oral BID  . potassium chloride  40 mEq Oral Once  . predniSONE  40 mg Oral Q breakfast  . senna-docusate  2 tablet Oral BID  . sorbitol, milk of mag, mineral oil, glycerin (SMOG) enema  960 mL Rectal Once    Continuous Infusions: . magnesium sulfate bolus IVPB    . potassium chloride      PRN Meds: bisacodyl, HYDROmorphone (DILAUDID) injection, iohexol, oxyCODONE  Physical Exam         Awake alert  No distress Regular work of breathing S 1 S 2  Abdomen is not distended Patient has no edema No focal deficits.   Vital Signs: BP 135/89   Pulse (!) 104   Temp 99.8 F (37.7 C)   Resp 20   Ht _0  (1.676 m)   Wt 49.9 kg   SpO2 99%   BMI 17.75 kg/m  SpO2: SpO2: 99 % O2 Device: O2 Device: Room Air O2 Flow Rate:    Intake/output summary: No intake or output data in the 24 hours ending 01/17/21 1354  LBM: Last BM Date: 01/10/21 Baseline Weight: Weight: 49.9 kg Most recent weight: Weight: 49.9 kg      PPS 50% Palliative Assessment/Data:      Patient Active Problem List   Diagnosis Date Noted  .  Malnutrition of moderate degree 01/16/2021  . Hypercalcemia of malignancy 01/14/2021  . 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  It is reported that patient's recent CT scan apparently showed worsening of metastatic diffuse lesions in her bones and the patient declined further chemotherapy.   Patient presented to [facility] emergency department and has been admitted to hospital medicine service.  She has been admitted for hypercalcemia of malignancy, stage IV metastatic bilateral breast cancer with bone pain.   Palliative medicine consulted for pain management and goals of care discussions.    Assessment:  Cancer related pain Constipation Generalized weakness Progressive malignancy  Recommendations/Plan: Spiritual care consult to complete HCPOA and other advance directives as per patient wishes, she met with chaplain on 01-16-21.  Continue current mode of care.  She received Dulcolax suppository earlier today,she is on Miralax to BID continue to monitor fr constipation, agree with the current bowel regimen.  Patient has medical background, states she understands the severity of her illness, however wishes to continue with full code and wishes to establish with [facility] oncologist going forward. Recommend establishment with outpatient palliative care through [facility] after discharge. No other PMT specific intervention at this time.     Goals of Care and Additional Recommendations: Limitations on Scope of Treatment: Full Scope Treatment  Code Status:    Code Status Orders  (From admission, onward)           Start      Ordered   01/14/21 1822  Full code  Continuous        01/14/21 1822           Code Status History     This patient has a current code status but no historical code status.       Prognosis:  Guarded   Discharge Planning: To Be Determined  Care plan was discussed with  patient.   Thank you for allowing the Palliative Medicine Team to assist in the care of this patient.   Time In: 1300 Time Out: 1325 Total Time 25 Prolonged Time Billed No       Greater than 50%  of this time was spent counseling and coordinating care related to the above assessment and plan.  Loistine Chance, MD  Please contact Palliative Medicine Team phone at [phone] for questions and concerns.

## 2021-01-18 ENCOUNTER — Inpatient Hospital Stay (HOSPITAL_COMMUNITY): Payer: Medicaid Other

## 2021-01-18 DIAGNOSIS — Z515 Encounter for palliative care: Secondary | ICD-10-CM | POA: Diagnosis not present

## 2021-01-18 DIAGNOSIS — Z7189 Other specified counseling: Secondary | ICD-10-CM | POA: Diagnosis not present

## 2021-01-18 DIAGNOSIS — R531 Weakness: Secondary | ICD-10-CM | POA: Diagnosis not present

## 2021-01-18 LAB — BASIC METABOLIC PANEL
Anion gap: 9 (ref 5–15)
BUN: 9 mg/dL (ref 6–20)
CO2: 25 mmol/L (ref 22–32)
Calcium: 9.3 mg/dL (ref 8.9–10.3)
Chloride: 100 mmol/L (ref 98–111)
Creatinine, Ser: 0.54 mg/dL (ref 0.44–1.00)
GFR, Estimated: >60 mL/min (ref >60)
Glucose, Bld: 118 mg/dL — ABNORMAL HIGH (ref 70–99)
Potassium: 3.8 mmol/L (ref 3.5–5.1)
Sodium: 134 mmol/L — ABNORMAL LOW (ref 135–145)

## 2021-01-18 LAB — URINALYSIS, ROUTINE W REFLEX MICROSCOPIC
Bilirubin Urine: NEGATIVE
Glucose, UA: NEGATIVE mg/dL
Hgb urine dipstick: NEGATIVE
Ketones, ur: NEGATIVE mg/dL
Leukocytes,Ua: NEGATIVE
Nitrite: NEGATIVE
Protein, ur: NEGATIVE mg/dL
Specific Gravity, Urine: 1.015 (ref 1.005–1.030)
pH: 6 (ref 5.0–8.0)

## 2021-01-18 LAB — CBC
HCT: 30.6 % — ABNORMAL LOW (ref 36.0–46.0)
Hemoglobin: 9.9 g/dL — ABNORMAL LOW (ref 12.0–15.0)
MCH: 28 pg (ref 26.0–34.0)
MCHC: 32.4 g/dL (ref 30.0–36.0)
MCV: 86.7 fL (ref 80.0–100.0)
Platelets: 223 10*3/uL (ref 150–400)
RBC: 3.53 MIL/uL — ABNORMAL LOW (ref 3.87–5.11)
RDW: 13.7 % (ref 11.5–15.5)
WBC: 9.5 10*3/uL (ref 4.0–10.5)
nRBC: 0 % (ref 0.0–0.2)

## 2021-01-18 LAB — PROCALCITONIN: Procalcitonin: 0.3 ng/mL

## 2021-01-18 LAB — MAGNESIUM: Magnesium: 2.2 mg/dL (ref 1.7–2.4)

## 2021-01-18 MED ORDER — NALOXEGOL OXALATE 25 MG PO TABS
25.0000 mg | ORAL_TABLET | Freq: Once | ORAL | Status: AC
  Administered 2021-01-18: 25 mg via ORAL
  Filled 2021-01-18: qty 1

## 2021-01-18 MED ORDER — SORBITOL 70 % SOLN
960.0000 mL | TOPICAL_OIL | Freq: Once | ORAL | Status: DC
  Filled 2021-01-18: qty 473

## 2021-01-18 MED ORDER — SODIUM CHLORIDE 0.9 % IV BOLUS
1000.0000 mL | Freq: Once | INTRAVENOUS | Status: AC
  Administered 2021-01-18: 1000 mL via INTRAVENOUS

## 2021-01-18 NOTE — Progress Notes (Signed)
Pt has BM today. Pt refused the enema today.will continue monitor the pt.

## 2021-01-18 NOTE — Progress Notes (Signed)
Daily Progress Note   Patient Name: Ashley Ortega       Date: 01/18/2021 DOB: September 28, 1970  Age: 50 y.o. MRN#: [id number] Attending Physician: [place] Eric J, DO Primary Care Physician: Pcp, No Admit Date: 01/14/2021  Reason for Consultation/Follow-up: Establishing goals of care  Subjective: Awake alert, going for x-rays.  Chart reviewed. Appears to have had a bowel movement on 01-17-2021.     Length of Stay: 4  Current Medications: Scheduled Meds:  . bictegravir-emtricitabine-tenofovir AF  1 tablet Oral Daily  . enoxaparin (LOVENOX) injection  40 mg Subcutaneous Q24H  . feeding supplement  237 mL Oral BID BM  . furosemide  20 mg Oral Daily  . polyethylene glycol  17 g Oral BID  . predniSONE  40 mg Oral Q breakfast  . senna-docusate  2 tablet Oral BID  . sorbitol, milk of mag, mineral oil, glycerin (SMOG) enema  960 mL Rectal Once    Continuous Infusions:    PRN Meds: acetaminophen **OR** acetaminophen, bisacodyl, HYDROmorphone (DILAUDID) injection, iohexol, oxyCODONE  Physical Exam         Awake alert  No distress Regular work of breathing S 1 S 2  Abdomen is not distended Patient has no edema No focal deficits.   Vital Signs: BP 120/77   Pulse (!) 113   Temp (!) 100.7 F (38.2 C) (Oral)   Resp 20   Ht 5' 6"  (1.676 m)   Wt 49.9 kg   SpO2 100%   BMI 17.75 kg/m  SpO2: SpO2: 100 % O2 Device: O2 Device: Room Air O2 Flow Rate:    Intake/output summary:  Intake/Output Summary (Last 24 hours) at 01/18/2021 1131 Last data filed at 01/18/2021 0900 Gross per 24 hour  Intake 144.47 ml  Output 1700 ml  Net -1555.53 ml    LBM: Last BM Date: 01/17/21 Baseline Weight: Weight: 49.9 kg Most recent weight: Weight: 49.9 kg      PPS 50% Palliative  Assessment/Data:      Patient Active Problem List   Diagnosis Date Noted  . Malnutrition of moderate degree 01/16/2021  . Hypercalcemia of malignancy 01/14/2021  . 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  It is reported that patient's recent CT scan apparently showed worsening of metastatic diffuse lesions in her bones and the patient declined further chemotherapy.   Patient presented to [facility] emergency department and has been admitted to hospital medicine service.  She has been admitted for hypercalcemia of malignancy, stage IV metastatic bilateral breast cancer with bone pain.   Palliative medicine consulted for pain management and goals of care discussions.    Assessment:  Cancer related pain Constipation Generalized weakness Progressive malignancy  Recommendations/Plan: Spiritual care consult to complete HCPOA and other advance directives as per patient wishes, she met with chaplain on 01-16-21.  Continue current mode of care.  Continue current bowel regimen.    Patient has medical background, states she understands the severity of her illness, however wishes to continue with full code and wishes to establish with  cancer center oncologist going forward.  I chart reviewed, note that the patient now has an appointment with Dr. Jana Ortega at [place] around this month. recommend establishment with outpatient palliative care through [facility] after discharge. No other PMT specific intervention at this time.     Goals of Care and Additional Recommendations: Limitations on Scope of Treatment: Full Scope  Treatment  Code Status:    Code Status Orders  (From admission, onward)           Start     Ordered   01/14/21 1822  Full code  Continuous        01/14/21 1822           Code Status History     This patient has a current code status but no historical code status.       Prognosis:  Guarded   Discharge Planning: To Be Determined  Care plan was discussed with  patient.   Thank you for allowing the Palliative Medicine Team to assist in the care of this patient.   Time In: 10 Time Out: 10.15 Total Time 15 Prolonged Time Billed No       Greater than 50%  of this time was spent counseling and coordinating care related to the above assessment and plan.  Ashley Chance, MD  Please contact Palliative Medicine Team phone at [phone] for questions and concerns.

## 2021-01-18 NOTE — Progress Notes (Signed)
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  Initially diagnosed with breast cancer in 2020, was started on chemotherapy but did not tolerate after 2 cycles.  Patient has been following with her oncologist at [facility] and recent CT scan showed patient had worsening of her metastatic lesions distributed in her bones and patient declined further chemotherapy.  Oncology recently started her on palliative care with high-dose narcotics.  Follows with outpatient palliative care clinic who manages her pain, but only has been seeing them via phone visits.  Roughly 4 weeks ago, patient had worsening shortness of breath and back pain, initially presented to [facility] ED on July 24 with CT angiogram negative for PE.  EDP consulted [facility] for further evaluation and treatment of intractable pain of malignancy, hypercalcemia and constipation.   Assessment & Plan:   Principal Problem:   Hypercalcemia of malignancy Active Problems:   Hypercalcemia   Malnutrition of moderate degree   Hypercalcemia of malignancy Upon presentation, patient's calcium noted to be elevated at 12.3.  Patient received dose of Zometa 4 mg IV on 8/16. --Ca 12.3>10.6>10.3>9.3 --Furosemide 20 mg p.o. daily --Encourage increase oral intake --Repeat BMP in a.m.  Stage IV metastatic breast cancer Followed by Dr. Merrie Roof at [facility]. Was referred to see radiation oncology in July 2022 but patient declined. Looking at phone records 12/19/2020, patient declined chemo, immunotherapy, radiation and was subsequently referred to palliative pain  management  --Continue prednisone 40 mg p.o. daily --Appointment scheduled at [place] with Dr. Jana Hakim on 8/23 at 4 PM  Cancer associated pain --Palliative care following, appreciate assistance --Oxycodone 10 mg q4h PRN moderate pain --Dilaudid 1 mg IV q3h PRN severe pain  Constipation Abdominal pain Acute abdominal series 8/19 with no evidence of dilated bowel loops or free intraperitoneal air, no significant radiographic abnormality other than no numerous lytic lesions throughout the axial/appendicular skeleton including large lesions involving the L1 and L4 vertebral bodies. --Senokot-as 2 tablets twice daily --MiraLAX twice daily --Repeat Movantik 25 mg p.o. x1 today --Repeat SMOG enema today --Bisacodyl suppository 10 mg daily as needed moderate constipation  HIV Followed by infectious disease, Dr. Otilio Miu at [facility] hospital. --[place]  Hx DVT 2020 Patient reports treated with Xarelto for 1 month then discontinued.  Moderate protein calorie malnutrition Body mass index is 17.75 kg/m. Nutrition Status: Nutrition Problem: Moderate Malnutrition Etiology: chronic illness, cancer and cancer related treatments Signs/Symptoms: moderate fat depletion, mild muscle depletion, percent weight loss, energy intake < or equal to 75% for > or equal to 1 month Interventions: Ensure Enlive (each supplement provides 350kcal and 20 grams of protein), MVI -- Dietitian following, appreciate assistance.  Continue supplements, encourage increase oral intake    DVT prophylaxis: enoxaparin (LOVENOX) injection 40 mg Start: 01/15/21 1400 SCDs Start: 01/14/21 1821   Code Status: Full Code Family Communication: No family present at bedside this morning  Disposition Plan:  Level of care: Med-Surg Status is: Inpatient  Remains inpatient appropriate because:Ongoing active pain requiring inpatient pain management, Unsafe d/c plan, and Inpatient level of care  appropriate due to severity of illness  Dispo: The  patient is from: Home              Anticipated d/c is to: Home              Patient currently is not medically stable to d/c.   Difficult to place patient No   Consultants:  Palliative care  Procedures:  None  Antimicrobials:  None   Subjective: Patient seen examined at bedside, complaining of continued abdominal pain/constipation.  Reports only small bowel movement yesterday but not documented by nursing staff.  Wishes to repeat Movantik and enema today.  Also fever overnight, declined Tylenol.  White blood cell count and procalcitonin reassuring this morning.  Acute abdominal series with no significant intra-abdominal process other than known lytic skeletal lesions.  No other questions or concerns at this time. Denies headache, no visual changes, no chest pain, no shortness of breath, no weakness, no fatigue, no paresthesias.  No acute events overnight per nursing.  Objective: Vitals:   01/18/21 0025 01/18/21 0426 01/18/21 0831 01/18/21 1225  BP: (!) 151/93 (!) 151/87 120/77 133/83  Pulse: (!) 123 (!) 118 (!) 113 (!) 127  Resp: '18 20 19 19  '$ Temp: (!) 100.9 F (38.3 C) (!) 100.4 F (38 C) (!) 100.7 F (38.2 C) 99.1 F (37.3 C)  TempSrc: Oral Oral Oral Oral  SpO2: 96% 98% 100% 98%  Weight:      Height:        Intake/Output Summary (Last 24 hours) at 01/18/2021 1410 Last data filed at 01/18/2021 0900 Gross per 24 hour  Intake 144.47 ml  Output 1700 ml  Net -1555.53 ml    Filed Weights   01/14/21 1028  Weight: 49.9 kg    Examination:  General exam: Appears calm and comfortable, chronically ill in appearance with fat/muscle wasting noted on exam Respiratory system: Clear to auscultation. Respiratory effort normal.  On room air Cardiovascular system: S1 & S2 heard, RRR. No JVD, murmurs, rubs, gallops or clicks. No pedal edema. Gastrointestinal system: Abdomen is nondistended, soft and mild generalized tenderness. No  organomegaly or masses felt. Normal bowel sounds heard. Central nervous system: Alert and oriented. No focal neurological deficits. Extremities: Symmetric 5 x 5 power. Skin: No rashes, lesions or ulcers Psychiatry: Judgement and insight appear normal. Mood & affect appropriate.     Data Reviewed: I have personally reviewed following labs and imaging studies  CBC: Recent Labs  Lab 01/14/21 1335 01/18/21 0630  WBC 7.8 9.5  NEUTROABS 5.4  --   HGB 11.3* 9.9*  HCT 34.0* 30.6*  MCV 85.6 86.7  PLT 188 [id number]   Basic Metabolic Panel: Recent Labs  Lab 01/15/21 0425 01/16/21 0513 01/17/21 0945 01/17/21 2023 01/18/21 0528  NA 139 139 139 136 134*  K 3.5 3.3* 2.9* 3.8 3.8  CL 107 102 100 99 100  CO2 '26 30 30 28 25  '$ GLUCOSE 78 96 100* 103* 118*  BUN '14 14 14 10 9  '$ CREATININE 0.61 0.54 0.68 0.62 0.54  CALCIUM 12.3*  12.3* 10.6* 10.3 9.1 9.3  MG  --   --  1.7  --  2.2   GFR: Estimated Creatinine Clearance: 68.5 mL/min (by C-G formula based on SCr of 0.54 mg/dL). Liver Function Tests: Recent Labs  Lab 01/14/21 1335  AST 45*  ALT 18  ALKPHOS 143*  BILITOT 1.1  PROT 8.5*  ALBUMIN 3.7   No results for input(s): LIPASE, AMYLASE in the last 168 hours. No results for input(s): AMMONIA in the last 168  hours. Coagulation Profile: No results for input(s): INR, PROTIME in the last 168 hours. Cardiac Enzymes: No results for input(s): CKTOTAL, CKMB, CKMBINDEX, TROPONINI in the last 168 hours. BNP (last 3 results) No results for input(s): PROBNP in the last 8760 hours. HbA1C: No results for input(s): HGBA1C in the last 72 hours. CBG: No results for input(s): GLUCAP in the last 168 hours. Lipid Profile: No results for input(s): CHOL, HDL, LDLCALC, TRIG, CHOLHDL, LDLDIRECT in the last 72 hours. Thyroid Function Tests: No results for input(s): TSH, T4TOTAL, FREET4, T3FREE, THYROIDAB in the last 72 hours. Anemia Panel: No results for input(s): VITAMINB12, FOLATE, FERRITIN, TIBC,  IRON, RETICCTPCT in the last 72 hours. Sepsis Labs: Recent Labs  Lab 01/18/21 0630  PROCALCITON 0.30    Recent Results (from the past 240 hour(s))  SARS CORONAVIRUS 2 (TAT 6-24 HRS) Nasopharyngeal Nasopharyngeal Swab     Status: None   Collection Time: 01/14/21  2:27 PM   Specimen: Nasopharyngeal Swab  Result Value Ref Range Status   SARS Coronavirus 2 NEGATIVE NEGATIVE Final    Comment: (NOTE) SARS-CoV-2 target nucleic acids are NOT DETECTED.  The SARS-CoV-2 RNA is generally detectable in upper and lower respiratory specimens during the acute phase of infection. Negative results do not preclude SARS-CoV-2 infection, do not rule out co-infections with other pathogens, and should not be used as the sole basis for treatment or other patient management decisions. Negative results must be combined with clinical observations, patient history, and epidemiological information. The expected result is Negative.  Fact Sheet for Patients: SugarRoll.be  Fact Sheet for Healthcare Providers: https://www.woods-mathews.com/  This test is not yet approved or cleared by the [place] FDA and  has been authorized for detection and/or diagnosis of SARS-CoV-2 by FDA under an Emergency Use Authorization (EUA). This EUA will remain  in effect (meaning this test can be used) for the duration of the COVID-19 declaration under Se ction 564(b)(1) of the Act, 21 U.S.C. section 360bbb-3(b)(1), unless the authorization is terminated or revoked sooner.  Performed at [facility], [place] [address] [place], [place] [address]          Radiology Studies: DG ABD ACUTE 2+V W 1V CHEST  Result Date: 01/18/2021 CLINICAL DATA:  Abdominal distension, abdominal pain. Breast cancer. EXAM: DG ABDOMEN ACUTE WITH 1 VIEW CHEST COMPARISON:  12/23/2020, 01/14/2021 FINDINGS: There is no evidence of dilated bowel loops or free intraperitoneal air. No radiopaque  calculi or other significant radiographic abnormality is seen. Heart size and mediastinal contours are within normal limits. Both lungs are clear. Numerous lytic lesions throughout the axial and appendicular skeleton including large lesions involving the L1 and L4 vertebral bodies. IMPRESSION: 1. Negative abdominal radiographs. No acute cardiopulmonary disease. 2. Widespread osseous metastatic disease, as seen on recent CT examinations. Electronically Signed   By: Davina Poke D.O.   On: 01/18/2021 13:43        Scheduled Meds:  bictegravir-emtricitabine-tenofovir AF  1 tablet Oral Daily   enoxaparin (LOVENOX) injection  40 mg Subcutaneous Q24H   feeding supplement  237 mL Oral BID BM   furosemide  20 mg Oral Daily   polyethylene glycol  17 g Oral BID   predniSONE  40 mg Oral Q breakfast   senna-docusate  2 tablet Oral BID   sorbitol, milk of mag, mineral oil, glycerin (SMOG) enema  960 mL Rectal Once   Continuous Infusions:     LOS: 4 days    Time spent: 46 minutes spent on  chart review, discussion with nursing staff, consultants, updating family and interview/physical exam; more than 50% of that time was spent in counseling and/or coordination of care.    Ceaser Ebeling J [place] DO [facility] Available via Epic secure chat 7am-7pm After these hours, please refer to coverage provider listed on [email or web address] 01/18/2021, 2:10 PM

## 2021-01-18 NOTE — Progress Notes (Signed)
This nurse pulled tylenol 650 mg for patient due to elevated temperature of 101. After opening tylenol and giving it to patient, patient stated "I don't take Tylenol, I don't want to mess my liver up". Advised patient that this was for elevated temperature, patient still refused Tylenol. This nurse discarded Tylenol in the trash bin since it had already been opened.

## 2021-01-18 NOTE — Progress Notes (Addendum)
   01/18/21 0831  Assess: MEWS Score  Temp (!) 100.7 F (38.2 C)  BP 120/77  Pulse Rate (!) 113  Level of Consciousness Alert  SpO2 100 %  O2 Device Room Air  Assess: MEWS Score  MEWS Temp 1  MEWS Systolic 0  MEWS Pulse 2  MEWS RR 0  MEWS LOC 0  MEWS Score 3  MEWS Score Color Yellow  Assess: if the MEWS score is Yellow or Red  Were vital signs taken at a resting state? Yes  Focused Assessment No change from prior assessment  Does the patient meet 2 or more of the SIRS criteria? Yes  Does the patient have a confirmed or suspected source of infection? Yes  Provider and Rapid Response Notified? Yes  MEWS guidelines implemented *See Row Information* No, previously yellow, continue vital signs every 4 hours  Treat  MEWS Interventions Administered prn meds/treatments  Take Vital Signs  Increase Vital Sign Frequency  Yellow: Q 2hr X 2 then Q 4hr X 2, if remains yellow, continue Q 4hrs  Escalate  MEWS: Escalate Yellow: discuss with charge nurse/RN and consider discussing with provider and RRT  Notify: Charge Nurse/RN  Name of Charge Nurse/RN Notified kaisa,RN  Date Charge Nurse/RN Notified 01/18/21  Document  Patient Outcome Other (Comment) (pt is stable given prn tylenol)  Assess: SIRS CRITERIA  SIRS Temperature  0  SIRS Pulse 1  SIRS Respirations  0  SIRS WBC 0  SIRS Score Sum  1   Pt is stable. Given prn medicine and pt was on yellow MEWS.continue vitals every 4hrs. Charge notified and MD Notified

## 2021-01-18 NOTE — Progress Notes (Signed)
   01/17/21 2025  Assess: MEWS Score  Temp (!) 101 F (38.3 C)  BP 131/89  Pulse Rate (!) 114  Resp 18  Level of Consciousness Alert  SpO2 93 %  Assess: MEWS Score  MEWS Temp 1  MEWS Systolic 0  MEWS Pulse 2  MEWS RR 0  MEWS LOC 0  MEWS Score 3  MEWS Score Color Yellow  Assess: SIRS CRITERIA  SIRS Temperature  1  SIRS Pulse 1  SIRS Respirations  0  SIRS WBC 0  SIRS Score Sum  2   Patient currently in yellow MEWS due to elevated heart rate and temperature of 101. Kathyrn Lass, RN charge nurse notified of yellow MEWS. Patient is alert and oriented x 4, there is no change in patient status. Paged on call NP Gershon Cull for Tylenol since Tylenol was not listed on patients MAR for temperature. Tylenol added by Gershon Cull, NP. Will continue to monitor patient and implement yellow MEWS guidelines.

## 2021-01-19 DIAGNOSIS — Z515 Encounter for palliative care: Secondary | ICD-10-CM | POA: Diagnosis not present

## 2021-01-19 DIAGNOSIS — R531 Weakness: Secondary | ICD-10-CM | POA: Diagnosis not present

## 2021-01-19 DIAGNOSIS — Z7189 Other specified counseling: Secondary | ICD-10-CM | POA: Diagnosis not present

## 2021-01-19 LAB — CBC
HCT: 27.2 % — ABNORMAL LOW (ref 36.0–46.0)
Hemoglobin: 8.9 g/dL — ABNORMAL LOW (ref 12.0–15.0)
MCH: 28.7 pg (ref 26.0–34.0)
MCHC: 32.7 g/dL (ref 30.0–36.0)
MCV: 87.7 fL (ref 80.0–100.0)
Platelets: 206 10*3/uL (ref 150–400)
RBC: 3.1 MIL/uL — ABNORMAL LOW (ref 3.87–5.11)
RDW: 13.9 % (ref 11.5–15.5)
WBC: 7.3 10*3/uL (ref 4.0–10.5)
nRBC: 0 % (ref 0.0–0.2)

## 2021-01-19 LAB — BASIC METABOLIC PANEL
Anion gap: 7 (ref 5–15)
BUN: 10 mg/dL (ref 6–20)
CO2: 27 mmol/L (ref 22–32)
Calcium: 8.3 mg/dL — ABNORMAL LOW (ref 8.9–10.3)
Chloride: 102 mmol/L (ref 98–111)
Creatinine, Ser: 0.51 mg/dL (ref 0.44–1.00)
GFR, Estimated: >60 mL/min (ref >60)
Glucose, Bld: 92 mg/dL (ref 70–99)
Potassium: 3.8 mmol/L (ref 3.5–5.1)
Sodium: 136 mmol/L (ref 135–145)

## 2021-01-19 LAB — PROCALCITONIN: Procalcitonin: 0.36 ng/mL

## 2021-01-19 MED ORDER — LORAZEPAM 0.5 MG PO TABS
0.5000 mg | ORAL_TABLET | ORAL | Status: DC | PRN
  Administered 2021-01-19 – 2021-01-20 (×6): 0.5 mg via ORAL
  Filled 2021-01-19 (×6): qty 1

## 2021-01-19 NOTE — Progress Notes (Signed)
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  Initially diagnosed with breast cancer in 2020, was started on chemotherapy but did not tolerate after 2 cycles.  Patient has been following with her oncologist at [facility] and recent CT scan showed patient had worsening of her metastatic lesions distributed in her bones and patient declined further chemotherapy.  Oncology recently started her on palliative care with high-dose narcotics.  Follows with outpatient palliative care clinic who manages her pain, but only has been seeing them via phone visits.  Roughly 4 weeks ago, patient had worsening shortness of breath and back pain, initially presented to [facility] ED on July 24 with CT angiogram negative for PE.  EDP consulted [facility] for further evaluation and treatment of intractable pain of malignancy, hypercalcemia and constipation.   Assessment & Plan:   Principal Problem:   Hypercalcemia of malignancy Active Problems:   Hypercalcemia   Malnutrition of moderate degree   Hypercalcemia of malignancy Upon presentation, patient's calcium noted to be elevated at 12.3.  Patient received dose of Zometa 4 mg IV on 8/16. --Ca 12.3>10.6>10.3>9.3>8.3 --Furosemide 20 mg p.o. daily --Encourage increase oral intake --Repeat BMP in a.m.  Stage IV metastatic breast cancer Followed by Dr. Merrie Roof at [facility]. Was referred to see radiation oncology in July 2022 but patient declined. Looking at phone records 12/19/2020, patient declined chemo, immunotherapy, radiation and was subsequently referred to palliative  pain management  --Appointment scheduled at [facility] with Dr. Jana Hakim on 8/23 at 4 PM --Ultimately very poor/grim prognosis, palliative care following with recommendations of outpatient palliative care on discharge  Cancer associated pain --Palliative care following, appreciate assistance --Oxycodone 10 mg q4h PRN moderate pain --Dilaudid 1 mg IV q3h PRN severe pain  Anxiety: --Ativan 0.5 mg p.o. every 4 hours as needed  Constipation Abdominal pain Acute abdominal series 8/19 with no evidence of dilated bowel loops or free intraperitoneal air, no significant radiographic abnormality other than no numerous lytic lesions throughout the axial/appendicular skeleton including large lesions involving the L1 and L4 vertebral bodies. --Senokot-as 2 tablets twice daily --MiraLAX twice daily --Bisacodyl suppository 10 mg daily as needed moderate constipation  HIV Followed by infectious disease, Dr. Otilio Miu at [facility]. --[place]  Hx DVT 2020 Patient reports treated with Xarelto for 1 month then discontinued.  Moderate protein calorie malnutrition Body mass index is 17.75 kg/m. Nutrition Status: Nutrition Problem: Moderate Malnutrition Etiology: chronic illness, cancer and cancer related treatments Signs/Symptoms: moderate fat depletion, mild muscle depletion, percent weight loss, energy intake < or equal to 75% for > or equal to 1 month Interventions: Ensure Enlive (each supplement provides 350kcal and 20 grams of protein), MVI -- Dietitian following, appreciate assistance.  Continue supplements, encourage increase oral intake    DVT prophylaxis: enoxaparin (LOVENOX) injection 40 mg Start: 01/15/21 1400 SCDs Start: 01/14/21 1821   Code Status: Full Code Family Communication: No family present at bedside this morning  Disposition Plan:  Level of care: Med-Surg Status is: Inpatient  Remains inpatient appropriate because:Ongoing active pain  requiring inpatient pain management, Unsafe d/c plan, and Inpatient level of care  appropriate due to severity of illness  Dispo: The patient is from: Home              Anticipated d/c is to: Home              Patient currently is not medically stable to d/c.   Difficult to place patient No   Consultants:  Palliative care  Procedures:  None  Antimicrobials:  None   Subjective: Patient seen examined at bedside, continues to complain of pain.  Also complaining of shortness of breath, although oxygenating 97% on room air.  Sister present at bedside and updated.  Reports bowel movement yesterday.  No other questions or concerns at this time. Denies headache, no visual changes, no chest pain, no shortness of breath, no weakness, no fatigue, no paresthesias.  No acute events overnight per nursing.  Objective: Vitals:   01/18/21 2040 01/19/21 0020 01/19/21 0441 01/19/21 1429  BP: 126/87 129/83 115/84 120/84  Pulse: (!) 114 (!) 105 92 (!) 108  Resp: '20 20 18   '$ Temp: 97.7 F (36.5 C) 99.8 F (37.7 C) 97.7 F (36.5 C) 100.2 F (37.9 C)  TempSrc: Oral Oral Oral Oral  SpO2: 100% 100% 100% 97%  Weight:      Height:        Intake/Output Summary (Last 24 hours) at 01/19/2021 1453 Last data filed at 01/19/2021 0900 Gross per 24 hour  Intake 1161.25 ml  Output --  Net 1161.25 ml    Filed Weights   01/14/21 1028  Weight: 49.9 kg    Examination:  General exam: Appears calm and comfortable, chronically ill in appearance with fat/muscle wasting noted on exam Respiratory system: Clear to auscultation. Respiratory effort normal.  On room air Cardiovascular system: S1 & S2 heard, RRR. No JVD, murmurs, rubs, gallops or clicks. No pedal edema. Gastrointestinal system: Abdomen is slightly distended, soft and mild generalized tenderness. No organomegaly or masses felt. Normal bowel sounds heard. Central nervous system: Alert and oriented. No focal neurological deficits. Extremities:  Symmetric 5 x 5 power. Skin: No rashes, lesions or ulcers Psychiatry: Judgement and insight appear normal. Mood & affect appropriate.     Data Reviewed: I have personally reviewed following labs and imaging studies  CBC: Recent Labs  Lab 01/14/21 1335 01/18/21 0630 01/19/21 0524  WBC 7.8 9.5 7.3  NEUTROABS 5.4  --   --   HGB 11.3* 9.9* 8.9*  HCT 34.0* 30.6* 27.2*  MCV 85.6 86.7 87.7  PLT 188 223 [id number]   Basic Metabolic Panel: Recent Labs  Lab 01/16/21 0513 01/17/21 0945 01/17/21 2023 01/18/21 0528 01/19/21 0524  NA 139 139 136 134* 136  K 3.3* 2.9* 3.8 3.8 3.8  CL 102 100 99 100 102  CO2 '30 30 28 25 27  '$ GLUCOSE 96 100* 103* 118* 92  BUN '14 14 10 9 10  '$ CREATININE 0.54 0.68 0.62 0.54 0.51  CALCIUM 10.6* 10.3 9.1 9.3 8.3*  MG  --  1.7  --  2.2  --    GFR: Estimated Creatinine Clearance: 68.5 mL/min (by C-G formula based on SCr of 0.51 mg/dL). Liver Function Tests: Recent Labs  Lab 01/14/21 1335  AST 45*  ALT 18  ALKPHOS 143*  BILITOT 1.1  PROT 8.5*  ALBUMIN 3.7   No results for input(s): LIPASE, AMYLASE in the last 168 hours. No results for input(s): AMMONIA in the last 168 hours. Coagulation Profile: No results for input(s): INR, PROTIME in the last 168 hours. Cardiac Enzymes:  No results for input(s): CKTOTAL, CKMB, CKMBINDEX, TROPONINI in the last 168 hours. BNP (last 3 results) No results for input(s): PROBNP in the last 8760 hours. HbA1C: No results for input(s): HGBA1C in the last 72 hours. CBG: No results for input(s): GLUCAP in the last 168 hours. Lipid Profile: No results for input(s): CHOL, HDL, LDLCALC, TRIG, CHOLHDL, LDLDIRECT in the last 72 hours. Thyroid Function Tests: No results for input(s): TSH, T4TOTAL, FREET4, T3FREE, THYROIDAB in the last 72 hours. Anemia Panel: No results for input(s): VITAMINB12, FOLATE, FERRITIN, TIBC, IRON, RETICCTPCT in the last 72 hours. Sepsis Labs: Recent Labs  Lab 01/18/21 0630 01/19/21 0524   PROCALCITON 0.30 0.36    Recent Results (from the past 240 hour(s))  SARS CORONAVIRUS 2 (TAT 6-24 HRS) Nasopharyngeal Nasopharyngeal Swab     Status: None   Collection Time: 01/14/21  2:27 PM   Specimen: Nasopharyngeal Swab  Result Value Ref Range Status   SARS Coronavirus 2 NEGATIVE NEGATIVE Final    Comment: (NOTE) SARS-CoV-2 target nucleic acids are NOT DETECTED.  The SARS-CoV-2 RNA is generally detectable in upper and lower respiratory specimens during the acute phase of infection. Negative results do not preclude SARS-CoV-2 infection, do not rule out co-infections with other pathogens, and should not be used as the sole basis for treatment or other patient management decisions. Negative results must be combined with clinical observations, patient history, and epidemiological information. The expected result is Negative.  Fact Sheet for Patients: SugarRoll.be  Fact Sheet for Healthcare Providers: https://www.woods-mathews.com/  This test is not yet approved or cleared by the [place] FDA and  has been authorized for detection and/or diagnosis of SARS-CoV-2 by FDA under an Emergency Use Authorization (EUA). This EUA will remain  in effect (meaning this test can be used) for the duration of the COVID-19 declaration under Se ction 564(b)(1) of the Act, 21 U.S.C. section 360bbb-3(b)(1), unless the authorization is terminated or revoked sooner.  Performed at [facility], [place] [address], [place], [place] [address]          Radiology Studies: DG ABD ACUTE 2+V W 1V CHEST  Result Date: 01/18/2021 CLINICAL DATA:  Abdominal distension, abdominal pain. Breast cancer. EXAM: DG ABDOMEN ACUTE WITH 1 VIEW CHEST COMPARISON:  12/23/2020, 01/14/2021 FINDINGS: There is no evidence of dilated bowel loops or free intraperitoneal air. No radiopaque calculi or other significant radiographic abnormality is seen. Heart size and  mediastinal contours are within normal limits. Both lungs are clear. Numerous lytic lesions throughout the axial and appendicular skeleton including large lesions involving the L1 and L4 vertebral bodies. IMPRESSION: 1. Negative abdominal radiographs. No acute cardiopulmonary disease. 2. Widespread osseous metastatic disease, as seen on recent CT examinations. Electronically Signed   By: Davina Poke D.O.   On: 01/18/2021 13:43        Scheduled Meds:  bictegravir-emtricitabine-tenofovir AF  1 tablet Oral Daily   enoxaparin (LOVENOX) injection  40 mg Subcutaneous Q24H   feeding supplement  237 mL Oral BID BM   furosemide  20 mg Oral Daily   polyethylene glycol  17 g Oral BID   senna-docusate  2 tablet Oral BID   sorbitol, milk of mag, mineral oil, glycerin (SMOG) enema  960 mL Rectal Once   Continuous Infusions:     LOS: 5 days    Time spent: 46 minutes spent on chart review, discussion with nursing staff, consultants, updating family and interview/physical exam; more than 50% of that time was spent in counseling  and/or coordination of care.    Paola Flynt J [place] DO [facility] Available via Epic secure chat 7am-7pm After these hours, please refer to coverage provider listed on [email or web address] 01/19/2021, 2:53 PM

## 2021-01-19 NOTE — Progress Notes (Signed)
   01/19/21 2010  Assess: MEWS Score  Temp 98.7 F (37.1 C)  BP 130/82  Pulse Rate (!) 111  Resp 20  SpO2 100 %  Assess: MEWS Score  MEWS Temp 0  MEWS Systolic 0  MEWS Pulse 2  MEWS RR 0  MEWS LOC 0  MEWS Score 2  MEWS Score Color Yellow  Assess: if the MEWS score is Yellow or Red  Were vital signs taken at a resting state? Yes  Focused Assessment No change from prior assessment  Does the patient meet 2 or more of the SIRS criteria? No  Does the patient have a confirmed or suspected source of infection? No  Provider and Rapid Response Notified? No  MEWS guidelines implemented *See Row Information* Yes  Treat  MEWS Interventions Escalated (See documentation below)  Pain Scale 0-10  Pain Score 8  Pain Type Acute pain;Chronic pain  Pain Location Back  Pain Orientation Upper;Mid;Lower  Pain Descriptors / Indicators Aching  Pain Frequency Constant  Pain Onset On-going  Patients Stated Pain Goal 1  Pain Intervention(s) Medication (See eMAR)  Take Vital Signs  Increase Vital Sign Frequency  Yellow: Q 2hr X 2 then Q 4hr X 2, if remains yellow, continue Q 4hrs  Escalate  MEWS: Escalate Yellow: discuss with charge nurse/RN and consider discussing with provider and RRT  Notify: Charge Nurse/RN  Name of Charge Nurse/RN Notified Rupinder RN  Date Charge Nurse/RN Notified 01/19/21  Time Charge Nurse/RN Notified 2033  Document  Patient Outcome Stabilized after interventions  Progress note created (see row info) Yes  Assess: SIRS CRITERIA  SIRS Temperature  0  SIRS Pulse 1  SIRS Respirations  0  SIRS WBC 0  SIRS Score Sum  1

## 2021-01-19 NOTE — Progress Notes (Signed)
Pt refuses to take any steroid, states "makes pain in my bones worse and makes me feel like im going to have a heart attack.

## 2021-01-19 NOTE — Progress Notes (Signed)
Daily Progress Note   Patient Name: Ashley Ortega       Date: 01/19/2021 DOB: January 24, 1971  Age: 50 y.o. MRN#: [id number] Attending Physician: [place] Eric J, DO Primary Care Physician: Pcp, No Admit Date: 01/14/2021  Reason for Consultation/Follow-up: Establishing goals of care  Subjective: Awake alert, resting in bed, denies any complaints, pain is well controlled.  She also had a bowel movement.  She is speaking with her sister and daughter on the phone.  She is anticipating going home soon.  She knows she has been set up with Dr. Jana Hakim at the cancer center later on this month.    Length of Stay: 5  Current Medications: Scheduled Meds:  . bictegravir-emtricitabine-tenofovir AF  1 tablet Oral Daily  . enoxaparin (LOVENOX) injection  40 mg Subcutaneous Q24H  . feeding supplement  237 mL Oral BID BM  . furosemide  20 mg Oral Daily  . polyethylene glycol  17 g Oral BID  . predniSONE  40 mg Oral Q breakfast  . senna-docusate  2 tablet Oral BID  . sorbitol, milk of mag, mineral oil, glycerin (SMOG) enema  960 mL Rectal Once    Continuous Infusions:    PRN Meds: bisacodyl, HYDROmorphone (DILAUDID) injection, iohexol, oxyCODONE  Physical Exam         Awake alert  No distress Regular work of breathing S 1 S 2  Abdomen is not distended Patient has no edema No focal deficits.   Vital Signs: BP 115/84 (BP Location: Right Arm)   Pulse 92   Temp 97.7 F (36.5 C) (Oral)   Resp 18   Ht '5\' 6"'$  (1.676 m)   Wt 49.9 kg   SpO2 100%   BMI 17.75 kg/m  SpO2: SpO2: 100 % O2 Device: O2 Device: Room Air O2 Flow Rate:    Intake/output summary:  Intake/Output Summary (Last 24 hours) at 01/19/2021 1104 Last data filed at 01/19/2021 0900 Gross per 24 hour  Intake 1161.25 ml  Output  --  Net 1161.25 ml    LBM: Last BM Date: 01/18/21 Baseline Weight: Weight: 49.9 kg Most recent weight: Weight: 49.9 kg      PPS 50% Palliative Assessment/Data:      Patient Active Problem List   Diagnosis Date Noted  . Malnutrition of moderate degree 01/16/2021  . Hypercalcemia of  malignancy 01/14/2021  . 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  She has past medical history significant for stage IV metastatic breast cancer to thoracic and lumbar vertebrae, HIV on Haart, and patient's oncology care is over at [facility].  It is reported that patient's recent CT scan apparently showed worsening of metastatic diffuse lesions in her bones and the patient declined further chemotherapy.   Patient presented to [facility] emergency department and has been admitted to hospital medicine service.  She has been admitted for hypercalcemia of malignancy, stage IV metastatic bilateral breast cancer with bone pain.   Palliative medicine consulted for pain management and goals of care discussions.    Assessment:  Cancer related pain Constipation Generalized weakness Progressive malignancy  Recommendations/Plan: Recommend completion of advance care directives when the patient follows up at [facility] cancer center Medication history reviewed, continue oxycodone immediate release on an as-needed basis for pain as well as current bowel regimen.   recommend establishment with outpatient palliative care after discharge. No other PMT specific intervention at this time.     Goals of Care and Additional Recommendations: Limitations on Scope of Treatment: Full Scope Treatment  Code Status:    Code Status Orders  (From admission, onward)           Start     Ordered   01/14/21 1822  Full code  Continuous        01/14/21  1822           Code Status History     This patient has a current code status but no historical code status.       Prognosis:  Guarded   Discharge Planning: To Be Determined  Care plan was discussed with  patient.   Thank you for allowing the Palliative Medicine Team to assist in the care of this patient.   Time In: 10 Time Out: 10.15 Total Time 15 Prolonged Time Billed No       Greater than 50%  of this time was spent counseling and coordinating care related to the above assessment and plan.  Loistine Chance, MD  Please contact Palliative Medicine Team phone at [phone] for questions and concerns.

## 2021-01-19 NOTE — Progress Notes (Signed)
PT Cancellation Note  Patient Details Name: Ashley Ortega MRN: [id number] DOB: [birth year]   Cancelled Treatment:    Reason Eval/Treat Not Completed: Other (comment). Amb with OT, defer today per OT   [facility] 01/19/2021, 3:27 PM

## 2021-01-19 NOTE — Evaluation (Signed)
Occupational Therapy Evaluation Patient Details Name: Ashley Ortega MRN: [id number] DOB: December 18, 1970 Today's Date: 01/19/2021    History of Present Illness Ashley Ortega is a 50 y.o. 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 Recent CT scan showed patient has had worsening of metastatic lesion diffused distributed in her bones started on palliative care with high-dose of narcotics.   Clinical Impression   This 50 yo female admitted with above presents to acute OT with PLOF of being able to do all of her own basic ADLs, IADLs, and going up/down 3 flights of steps to her condo. She had gotten herself a shower seat and 3n1 for ease of bathing and dressing. She currently is setup/S-Mod A for basic ADLs and min A-min guard A for mobility. She will continue to benefit from acute OT with follow up Ashley Ortega.    Follow Up Recommendations  Home health OT;Supervision/Assistance - 24 hour    Equipment Recommendations  Other (comment) (RW)       Precautions / Restrictions Precautions Precautions: Fall Restrictions Weight Bearing Restrictions: No      Mobility Bed Mobility Overal bed mobility: Needs Assistance Bed Mobility: Supine to Sit;Sit to Supine     Supine to sit: Min assist;HOB elevated Sit to supine: Min assist;HOB elevated   General bed mobility comments: legs    Transfers Overall transfer level: Needs assistance Equipment used: Rolling walker (2 wheeled) Transfers: Sit to/from Stand Sit to Stand: Min assist         General transfer comment: Pt wants to hold onto RW for sit<>stand due to it does not hurt her back as much (family aware they need to steady walker if she continues to want/need to do this). Pt ambulated the entire length of hallway with RW with min guard A.    Balance Overall balance  assessment: Needs assistance Sitting-balance support: No upper extremity supported;Feet supported Sitting balance-Leahy Scale: Fair     Standing balance support: Bilateral upper extremity supported Standing balance-Leahy Scale: Poor                             ADL either performed or assessed with clinical judgement   ADL Overall ADL's : Needs assistance/impaired Eating/Feeding: Independent;Sitting   Grooming: Set up;Sitting   Upper Body Bathing: Set up;Sitting   Lower Body Bathing: Moderate assistance Lower Body Bathing Details (indicate cue type and reason): min A sit<>stand raised surface Upper Body Dressing : Set up;Sitting   Lower Body Dressing: Maximal assistance Lower Body Dressing Details (indicate cue type and reason): min A sit<>stand raised surface Toilet Transfer: Minimal assistance;Ambulation;RW   Toileting- Clothing Manipulation and Hygiene: Minimal assistance;Sit to/from stand               Vision Patient Visual Report: No change from baseline              Pertinent Vitals/Pain Pain Assessment: Faces Faces Pain Scale: Hurts little more Pain Location: low back Pain Descriptors / Indicators: Aching;Sore Pain Intervention(s): Limited activity within patient's tolerance;Monitored during session;Repositioned;Heat applied     Hand Dominance Right   Extremity/Trunk Assessment Upper Extremity Assessment Upper Extremity Assessment: Overall WFL for tasks assessed           Communication Communication Communication: No difficulties   Cognition Arousal/Alertness: Awake/alert Behavior During Therapy:  WFL for tasks assessed/performed Overall Cognitive Status: Within Functional Limits for tasks assessed                                                Home Living Family/patient expects to be discharged to:: Private residence Living Arrangements: Other relatives Available Help at Discharge: Family;Available 24  hours/day Type of Home: Apartment Home Access: Stairs to enter Entrance Stairs-Number of Steps: 10-landing-10-landing-10-level where she lives Entrance Stairs-Rails: Left Home Layout: One level     Bathroom Shower/Tub: Tub/shower unit;Curtain   Biochemist, clinical: Standard     Home Equipment: Bedside commode;Shower seat          Prior Functioning/Environment Level of Independence: Independent                 OT Problem List: Decreased range of motion;Decreased strength;Impaired balance (sitting and/or standing);Pain      OT Treatment/Interventions: Self-care/ADL training;DME and/or AE instruction;Patient/family education;Balance training    OT Goals(Current goals can be found in the care plan section) Acute Rehab OT Goals Patient Stated Goal: for pain to be under control and go home getting back to being up and about like I usually am OT Goal Formulation: With patient Time For Goal Achievement: 02/02/21 Potential to Achieve Goals: Good  OT Frequency: Min 2X/week    AM-PAC OT "6 Clicks" Daily Activity     Outcome Measure Help from another person eating meals?: None Help from another person taking care of personal grooming?: A Little Help from another person toileting, which includes using toliet, bedpan, or urinal?: A Little Help from another person bathing (including washing, rinsing, drying)?: A Little Help from another person to put on and taking off regular upper body clothing?: A Little Help from another person to put on and taking off regular lower body clothing?: A Lot 6 Click Score: 18   End of Session Equipment Utilized During Treatment: Gait belt;Rolling walker  Activity Tolerance: Patient tolerated treatment well Patient left: in bed;with call bell/phone within reach;with bed alarm set;with family/visitor present  OT Visit Diagnosis: Unsteadiness on feet (R26.81);Other abnormalities of gait and mobility (R26.89);Pain Pain - part of body:  (low back)                 Time: 1424-1500 OT Time Calculation (min): 36 min Charges:  OT General Charges $OT Visit: 1 Visit OT Evaluation $OT Eval Moderate Complexity: 1 Mod OT Treatments $Self Care/Home Management : 8-22 mins  [address], OTR/L Acute [employer] Pager [phone] Office [phone]    Almon Register 01/19/2021, 3:15 PM

## 2021-01-20 LAB — URINE CULTURE: Culture: <10000 — AB

## 2021-01-20 LAB — PROCALCITONIN: Procalcitonin: 0.33 ng/mL

## 2021-01-20 LAB — PTH-RELATED PEPTIDE: PTH-related peptide: <2 pmol/L

## 2021-01-20 MED ORDER — FENTANYL 25 MCG/HR TD PT72
1.0000 | MEDICATED_PATCH | TRANSDERMAL | Status: DC
  Administered 2021-01-20: 1 via TRANSDERMAL
  Filled 2021-01-20: qty 1

## 2021-01-20 NOTE — Evaluation (Signed)
Physical Therapy Evaluation Patient Details Name: Ashley Ortega MRN: [id number] DOB: 1970-08-04 Today's Date: 01/20/2021   History of Present Illness  Ashley Ortega is a 50 y.o. 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 Recent CT scan showed patient has had worsening of metastatic lesion diffused distributed in her bones started on palliative care with high-dose of narcotics.  Clinical Impression  Pt admitted with above diagnosis.  PT motivated to maintain as much independence as possible. Plans to return to her apt with 30 steps/3 flights to enter, will likely need non-emergency ambulance transport home. May benefit from [place] as well. Will continue to follow in acute setting    Pt currently with functional limitations due to the deficits listed below (see PT Problem List). Pt will benefit from skilled PT to increase their independence and safety with mobility to allow discharge to the venue listed below.       Follow Up Recommendations Home health PT;Supervision/Assistance - 24 hour    Equipment Recommendations  Rolling walker with 5" wheels    Recommendations for Other Services       Precautions / Restrictions Precautions Precautions: Fall Restrictions Weight Bearing Restrictions: No      Mobility  Bed Mobility Overal bed mobility: Needs Assistance Bed Mobility: Supine to Sit     Supine to sit: Min assist     General bed mobility comments: assist LLE, pt able to self assist bile LEs with UEs    Transfers Overall transfer level: Needs assistance Equipment used: Rolling walker (2 wheeled) Transfers: Sit to/from Stand Sit to Stand: Min assist         General transfer comment: Pt wants to hold onto RW for sit<>stand due to it does not hurt her back as much (family aware they need to  steady walker if she continues to want/need to do this)  Ambulation/Gait Ambulation/Gait assistance: Min assist;Min guard Gait Distance (Feet): 10 Feet Assistive device: Rolling walker (2 wheeled) Gait Pattern/deviations: Step-to pattern;Decreased stance time - left     General Gait Details: cues for sequence, RW position (pt wanted to amb to bathroom, wants to shower--motivated to walk but not at this time)-planning to shower  Stairs            Wheelchair Mobility    Modified Rankin (Stroke Patients Only)       Balance Overall balance assessment: Needs assistance Sitting-balance support: No upper extremity supported;Feet supported Sitting balance-Leahy Scale: Fair     Standing balance support: Bilateral upper extremity supported Standing balance-Leahy Scale: Poor Standing balance comment: reliant on UEs                             Pertinent Vitals/Pain Pain Assessment: Faces Faces Pain Scale: Hurts little more Pain Location: low back Pain Descriptors / Indicators: Aching;Sore Pain Intervention(s): Limited activity within patient's tolerance;Monitored during session;Premedicated before session;Repositioned    Home Living Family/patient expects to be discharged to:: Private residence Living Arrangements: Other relatives Available Help at Discharge: Family;Available 24 hours/day Type of Home: Apartment Home Access: Stairs to enter   Entrance Stairs-Number of Steps: 10-landing-10-landing-10-level where she lives (per pt plan is to d/c to her apt) Home Layout: One level Home Equipment: Bedside commode;Shower seat      Prior Function Level of Independence: Independent  Hand Dominance        Extremity/Trunk Assessment   Upper Extremity Assessment Upper Extremity Assessment: Overall WFL for tasks assessed    Lower Extremity Assessment Lower Extremity Assessment: Generalized weakness;RLE deficits/detail;LLE  deficits/detail RLE Deficits / Details: grossly 3+/5 LLE Deficits / Details: 2+ to 3/5       Communication   Communication: No difficulties  Cognition Arousal/Alertness: Awake/alert Behavior During Therapy: WFL for tasks assessed/performed Overall Cognitive Status: Within Functional Limits for tasks assessed                                        General Comments      Exercises     Assessment/Plan    PT Assessment Patient needs continued PT services  PT Problem List Decreased strength;Decreased mobility;Decreased activity tolerance;Decreased balance;Decreased knowledge of use of DME;Pain       PT Treatment Interventions DME instruction;Therapeutic activities;Gait training;Functional mobility training;Therapeutic exercise;Patient/family education    PT Goals (Current goals can be found in the Care Plan section)  Acute Rehab PT Goals Patient Stated Goal: for pain to be under control and go home getting back to being up and about like I usually am PT Goal Formulation: With patient Time For Goal Achievement: 01/27/21 Potential to Achieve Goals: Good    Frequency Min 3X/week   Barriers to discharge        Co-evaluation               AM-PAC PT "6 Clicks" Mobility  Outcome Measure Help needed turning from your back to your side while in a flat bed without using bedrails?: A Little Help needed moving from lying on your back to sitting on the side of a flat bed without using bedrails?: A Little Help needed moving to and from a bed to a chair (including a wheelchair)?: A Little Help needed standing up from a chair using your arms (e.g., wheelchair or bedside chair)?: A Little Help needed to walk in hospital room?: A Little Help needed climbing 3-5 steps with a railing? : A Lot 6 Click Score: 17    End of Session Equipment Utilized During Treatment: Gait belt Activity Tolerance: Patient tolerated treatment well Patient left: Other (comment);with  family/visitor present;with nursing/sitter in room (bathroom) Nurse Communication: Mobility status PT Visit Diagnosis: Other abnormalities of gait and mobility (R26.89);Muscle weakness (generalized) (M62.81)    Time: 1205-1229 PT Time Calculation (min) (ACUTE ONLY): 24 min   Charges:   PT Evaluation $PT Eval Low Complexity: [place], PT  Acute Rehab Dept ([place]) [phone] Pager [phone]  01/20/2021   [facility] 01/20/2021, 12:39 PM

## 2021-01-20 NOTE — Progress Notes (Signed)
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  Initially diagnosed with breast cancer in 2020, was started on chemotherapy but did not tolerate after 2 cycles.  Patient has been following with her oncologist at [facility] and recent CT scan showed patient had worsening of her metastatic lesions distributed in her bones and patient declined further chemotherapy.  Oncology recently started her on palliative care with high-dose narcotics.  Follows with outpatient palliative care clinic who manages her pain, but only has been seeing them via phone visits.  Roughly 4 weeks ago, patient had worsening shortness of breath and back pain, initially presented to [facility] ED on July 24 with CT angiogram negative for PE.  EDP consulted [facility] for further evaluation and treatment of intractable pain of malignancy, hypercalcemia and constipation.   Assessment & Plan:   Principal Problem:   Hypercalcemia of malignancy Active Problems:   Hypercalcemia   Malnutrition of moderate degree   Hypercalcemia of malignancy Upon presentation, patient's calcium noted to be elevated at 12.3.  Patient received dose of Zometa 4 mg IV on 8/16. --Ca 12.3>10.6>10.3>9.3>8.3 --Furosemide 20 mg p.o. daily --Encourage increase oral intake --Repeat BMP in a.m.  Stage IV metastatic breast cancer Followed by Dr. Merrie Roof at [facility]. Was referred to see radiation oncology in July 2022 but patient declined. Looking at phone records 12/19/2020, patient declined chemo, immunotherapy, radiation and was subsequently referred to palliative  pain management  --Appointment scheduled at [facility] with Dr. Jana Hakim on 8/23 at 4 PM --Ultimately very poor/grim prognosis, palliative care following with recommendations of outpatient palliative care on discharge  Cancer associated pain Patient with imaging notable for widespread osseous metastatic disease with lytic lesions at most vertebral levels, numerous bilateral rib lesions, nonacute T5 superior endplate fracture, pelvis notable for left posterior ilium, right sacral ala, left ischial tuberosity metastases with extraosseous tumor extension. --Palliative care following, appreciate assistance --Start fentanyl patch 25 mcg q72h --Oxycodone 10 mg q4h PRN moderate pain --Dilaudid 1 mg IV q3h PRN severe pain  Anxiety: --Ativan 0.5 mg p.o. q4h prn  Constipation Abdominal pain Acute abdominal series 8/19 with no evidence of dilated bowel loops or free intraperitoneal air, no significant radiographic abnormality other than no numerous lytic lesions throughout the axial/appendicular skeleton including large lesions involving the L1 and L4 vertebral bodies. --Senokot-as 2 tablets twice daily --MiraLAX twice daily --Bisacodyl suppository 10 mg daily as needed moderate constipation  HIV Followed by infectious disease, Dr. Otilio Miu at [facility]. --[place]  Hx DVT 2020 Patient reports treated with Xarelto for 1 month then discontinued.  Moderate protein calorie malnutrition Body mass index is 17.75 kg/m. Nutrition Status: Nutrition Problem: Moderate Malnutrition Etiology: chronic illness, cancer and cancer related treatments Signs/Symptoms: moderate fat depletion, mild muscle depletion, percent weight loss, energy intake < or equal to 75% for > or equal to 1 month Interventions: Ensure Enlive (each supplement provides 350kcal and 20 grams of protein), MVI -- Dietitian following, appreciate assistance.  Continue supplements, encourage increase oral  intake    DVT prophylaxis: enoxaparin (LOVENOX) injection 40 mg Start: 01/15/21 1400 SCDs Start: 01/14/21 1821  Code Status: Full Code Family Communication: No family present at bedside this morning  Disposition Plan:  Level of care: Med-Surg Status is: Inpatient  Remains inpatient appropriate because:Ongoing active pain requiring inpatient pain management, Unsafe d/c plan, and Inpatient level of care appropriate due to severity of illness  Dispo: The patient is from: Home              Anticipated d/c is to: Home              Patient currently is not medically stable to d/c.   Difficult to place patient No   Consultants:  Palliative care  Procedures:  None  Antimicrobials:  None   Subjective: Patient seen examined at bedside, continues to complain of pain.  States he needs to work on a bowel movement today.  Discussed starting longer acting pain treatment today and agrees to fentanyl patch given 7 doses of IV Dilaudid past 24 hours.  Has utilized 4 doses of oral oxycodone past 24 hours.  Shortness of breath yesterday improved with Ativan, likely anxiety related.  No other questions or concerns at this time. Denies headache, no visual changes, no chest pain, no shortness of breath, no weakness, no fatigue, no paresthesias.  No acute events overnight per nursing.  Objective: Vitals:   01/19/21 2202 01/20/21 0004 01/20/21 0409 01/20/21 0812  BP: 116/81 110/65 114/75 120/90  Pulse: (!) 107 (!) 107 99 (!) 114  Resp: '20 18 16 18  '$ Temp: 99.1 F (37.3 C) 98.2 F (36.8 C) 98.9 F (37.2 C) 98.6 F (37 C)  TempSrc: Oral Oral Axillary   SpO2: 99% 100% 99% 100%  Weight:      Height:        Intake/Output Summary (Last 24 hours) at 01/20/2021 1129 Last data filed at 01/19/2021 1700 Gross per 24 hour  Intake 200 ml  Output --  Net 200 ml    Filed Weights   01/14/21 1028  Weight: 49.9 kg    Examination:  General exam: Appears calm and comfortable, chronically ill in  appearance with fat/muscle wasting noted on exam Respiratory system: Clear to auscultation. Respiratory effort normal.  On room air Cardiovascular system: S1 & S2 heard, RRR. No JVD, murmurs, rubs, gallops or clicks. No pedal edema. Gastrointestinal system: Abdomen is slightly distended, soft and mild generalized tenderness. No organomegaly or masses felt. Normal bowel sounds heard. Central nervous system: Alert and oriented. No focal neurological deficits. Extremities: Symmetric 5 x 5 power. Skin: No rashes, lesions or ulcers Psychiatry: Judgement and insight appear normal. Mood & affect appropriate.     Data Reviewed: I have personally reviewed following labs and imaging studies  CBC: Recent Labs  Lab 01/14/21 1335 01/18/21 0630 01/19/21 0524  WBC 7.8 9.5 7.3  NEUTROABS 5.4  --   --   HGB 11.3* 9.9* 8.9*  HCT 34.0* 30.6* 27.2*  MCV 85.6 86.7 87.7  PLT 188 223 [id number]   Basic Metabolic Panel: Recent Labs  Lab 01/16/21 0513 01/17/21 0945 01/17/21 2023 01/18/21 0528 01/19/21 0524  NA 139 139 136 134* 136  K 3.3* 2.9* 3.8 3.8 3.8  CL 102 100 99 100 102  CO2 '30 30 28 25 27  '$ GLUCOSE 96 100* 103* 118* 92  BUN '14 14 10 9 10  '$ CREATININE 0.54 0.68 0.62 0.54 0.51  CALCIUM 10.6* 10.3 9.1 9.3 8.3*  MG  --  1.7  --  2.2  --    GFR: Estimated Creatinine Clearance: 68.5 mL/min (by  C-G formula based on SCr of 0.51 mg/dL). Liver Function Tests: Recent Labs  Lab 01/14/21 1335  AST 45*  ALT 18  ALKPHOS 143*  BILITOT 1.1  PROT 8.5*  ALBUMIN 3.7   No results for input(s): LIPASE, AMYLASE in the last 168 hours. No results for input(s): AMMONIA in the last 168 hours. Coagulation Profile: No results for input(s): INR, PROTIME in the last 168 hours. Cardiac Enzymes: No results for input(s): CKTOTAL, CKMB, CKMBINDEX, TROPONINI in the last 168 hours. BNP (last 3 results) No results for input(s): PROBNP in the last 8760 hours. HbA1C: No results for input(s): HGBA1C in the last 72  hours. CBG: No results for input(s): GLUCAP in the last 168 hours. Lipid Profile: No results for input(s): CHOL, HDL, LDLCALC, TRIG, CHOLHDL, LDLDIRECT in the last 72 hours. Thyroid Function Tests: No results for input(s): TSH, T4TOTAL, FREET4, T3FREE, THYROIDAB in the last 72 hours. Anemia Panel: No results for input(s): VITAMINB12, FOLATE, FERRITIN, TIBC, IRON, RETICCTPCT in the last 72 hours. Sepsis Labs: Recent Labs  Lab 01/18/21 0630 01/19/21 0524 01/20/21 0445  PROCALCITON 0.30 0.36 0.33    Recent Results (from the past 240 hour(s))  SARS CORONAVIRUS 2 (TAT 6-24 HRS) Nasopharyngeal Nasopharyngeal Swab     Status: None   Collection Time: 01/14/21  2:27 PM   Specimen: Nasopharyngeal Swab  Result Value Ref Range Status   SARS Coronavirus 2 NEGATIVE NEGATIVE Final    Comment: (NOTE) SARS-CoV-2 target nucleic acids are NOT DETECTED.  The SARS-CoV-2 RNA is generally detectable in upper and lower respiratory specimens during the acute phase of infection. Negative results do not preclude SARS-CoV-2 infection, do not rule out co-infections with other pathogens, and should not be used as the sole basis for treatment or other patient management decisions. Negative results must be combined with clinical observations, patient history, and epidemiological information. The expected result is Negative.  Fact Sheet for Patients: SugarRoll.be  Fact Sheet for Healthcare Providers: https://www.woods-mathews.com/  This test is not yet approved or cleared by the [place] FDA and  has been authorized for detection and/or diagnosis of SARS-CoV-2 by FDA under an Emergency Use Authorization (EUA). This EUA will remain  in effect (meaning this test can be used) for the duration of the COVID-19 declaration under Se ction 564(b)(1) of the Act, 21 U.S.C. section 360bbb-3(b)(1), unless the authorization is terminated or revoked sooner.  Performed  at [facility], [place] [address], [place], [place] [address]   Urine Culture     Status: Abnormal   Collection Time: 01/18/21  6:48 AM   Specimen: Urine, Clean Catch  Result Value Ref Range Status   Specimen Description   Final    URINE, CLEAN CATCH Performed at [facility], [place] [address] [place], [place] [address]    Special Requests   Final    NONE Performed at [facility], [place] [address], [place], [place] [address]    Culture (A)  Final    <10,000 COLONIES/mL INSIGNIFICANT GROWTH Performed at [place] [address] [place], [place] [address]    Report Status 01/20/2021 FINAL  Final         Radiology Studies: No results found.      Scheduled Meds:  bictegravir-emtricitabine-tenofovir AF  1 tablet Oral Daily   enoxaparin (LOVENOX) injection  40 mg Subcutaneous Q24H   feeding supplement  237 mL Oral BID BM   fentaNYL  1 patch Transdermal Q72H   furosemide  20 mg Oral Daily   polyethylene glycol  17 g Oral BID   senna-docusate  2 tablet Oral BID   sorbitol, milk of mag, mineral oil, glycerin (SMOG) enema  960 mL Rectal Once   Continuous Infusions:     LOS: 6 days    Time spent: 39 minutes spent on chart review, discussion with nursing staff, consultants, updating family and interview/physical exam; more than 50% of that time was spent in counseling and/or coordination of care.    Delitha Elms J [place] DO [facility] Available via Epic secure chat 7am-7pm After these hours, please refer to coverage provider listed on [email or web address] 01/20/2021, 11:29 AM

## 2021-01-20 NOTE — Progress Notes (Signed)
Pt on yellow due to high heart rate, no change in LOC. Other v/s wdl. DO [place] on floor, advised most likely due to incresed pain d/t DX. New order for fetynal patch.

## 2021-01-20 NOTE — Progress Notes (Signed)
Continues to be in Yellow MEWS with elevated HR, only rating pain 3/10. Encouraged increased PO fluid intake.  01/20/21 2006  Assess: MEWS Score  Temp 99.6 F (37.6 C)  BP 118/69  Pulse Rate (!) 125  Resp 18  Level of Consciousness Alert  SpO2 97 %  O2 Device Room Air  Assess: MEWS Score  MEWS Temp 0  MEWS Systolic 0  MEWS Pulse 2  MEWS RR 0  MEWS LOC 0  MEWS Score 2  MEWS Score Color Yellow  Assess: if the MEWS score is Yellow or Red  Were vital signs taken at a resting state? Yes  Focused Assessment No change from prior assessment  Does the patient meet 2 or more of the SIRS criteria? No  Does the patient have a confirmed or suspected source of infection? No  Provider and Rapid Response Notified? No  MEWS guidelines implemented *See Row Information* No, previously yellow, continue vital signs every 4 hours  Treat  MEWS Interventions Administered prn meds/treatments  Pain Scale 0-10  Pain Score 3  Pain Type Acute pain;Chronic pain  Pain Location Back  Pain Orientation Upper;Mid;Lower  Pain Radiating Towards neck  Pain Descriptors / Indicators Aching  Pain Frequency Constant  Pain Onset On-going  Patients Stated Pain Goal 1  Pain Intervention(s) RN made aware;Heat applied;Repositioned  Multiple Pain Sites Yes  2nd Pain Site  Pain Score 3  Pain Type Acute pain;Chronic pain  Pain Location Leg  Pain Orientation Left  Pain Radiating Towards hip  Pain Descriptors / Indicators Aching  Pain Frequency Constant  Pain Onset With Activity  Patient's Stated Pain Goal 1  Pain Intervention(s) RN made aware;Repositioned;Heat applied  Assess: SIRS CRITERIA  SIRS Temperature  0  SIRS Pulse 1  SIRS Respirations  0  SIRS WBC 0  SIRS Score Sum  1

## 2021-01-20 NOTE — Progress Notes (Signed)
Pt has had high HR all day no change in other v/s. DO made aware. Added fetynal patch this a.m advised HR due to high level of pain due to DX.

## 2021-01-21 ENCOUNTER — Other Ambulatory Visit: Payer: Self-pay | Admitting: *Deleted

## 2021-01-21 DIAGNOSIS — C799 Secondary malignant neoplasm of unspecified site: Secondary | ICD-10-CM | POA: Diagnosis present

## 2021-01-21 DIAGNOSIS — Z515 Encounter for palliative care: Secondary | ICD-10-CM

## 2021-01-21 LAB — MAGNESIUM: Magnesium: 2.1 mg/dL (ref 1.7–2.4)

## 2021-01-21 LAB — BASIC METABOLIC PANEL
Anion gap: 6 (ref 5–15)
BUN: 10 mg/dL (ref 6–20)
CO2: 28 mmol/L (ref 22–32)
Calcium: 8.4 mg/dL — ABNORMAL LOW (ref 8.9–10.3)
Chloride: 99 mmol/L (ref 98–111)
Creatinine, Ser: 0.58 mg/dL (ref 0.44–1.00)
GFR, Estimated: >60 mL/min (ref >60)
Glucose, Bld: 101 mg/dL — ABNORMAL HIGH (ref 70–99)
Potassium: 3.8 mmol/L (ref 3.5–5.1)
Sodium: 133 mmol/L — ABNORMAL LOW (ref 135–145)

## 2021-01-21 MED ORDER — LORAZEPAM 0.5 MG PO TABS: 0.5000 mg | ORAL_TABLET | ORAL | 0 refills | Status: DC | PRN

## 2021-01-21 MED ORDER — POLYETHYLENE GLYCOL 3350 17 G PO PACK: 17.0000 g | PACK | Freq: Two times a day (BID) | ORAL | 2 refills | Status: AC

## 2021-01-21 MED ORDER — BISACODYL 10 MG RE SUPP: 10.0000 mg | Freq: Every day | RECTAL | 2 refills | Status: AC | PRN

## 2021-01-21 MED ORDER — FENTANYL 12 MCG/HR TD PT72: 1.0000 | MEDICATED_PATCH | TRANSDERMAL | 0 refills | Status: DC

## 2021-01-21 MED ORDER — SENNOSIDES-DOCUSATE SODIUM 8.6-50 MG PO TABS: 2.0000 | ORAL_TABLET | Freq: Two times a day (BID) | ORAL | 2 refills | Status: DC

## 2021-01-21 NOTE — Progress Notes (Signed)
Mobility Specialist - Progress Note     01/21/21 1129  Mobility  Activity Ambulated in hall  Level of Assistance Contact guard assist, steadying assist  [place] wheel walker  Distance Ambulated (ft) 500 ft  Mobility Ambulated with assistance in hallway  Mobility Response Tolerated well  Mobility performed by Mobility specialist  $Mobility charge 1 Mobility     Upon entry pt was in recliner and agreeable to ambulate. Pt used RW to ambulated 500 ft in hallway. No reports of SOB, pain, or dizziness made. Pt returned to room after session and was left in recliner with family in room and awaiting d/c.   [place] Specialist Acute Rehabilitation Services Phone: [phone] 01/21/21, 11:31 AM

## 2021-01-21 NOTE — Discharge Summary (Signed)
Physician Discharge Summary  Ashley Ortega [id number] DOB: 28-Nov-1970 DOA: 01/14/2021  PCP: Merryl Hacker, No  Admit date: 01/14/2021 Discharge date: 01/21/2021  Admitted From: Home Disposition: Home with outpatient palliative care to follow  Recommendations for Outpatient Follow-up:  Follow up with PCP in 1-2 weeks Follow-up arranged with medical oncology, Dr. Jana Hakim scheduled on 01/22/2021 at 4 PM Started on fentanyl patch 12.5 mcg; continue home oxycodone as needed Started on bowel regimen for constipation Recommend repeat BMP 1 week to assess calcium level. 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  Initially diagnosed with breast cancer in 2020, was started on chemotherapy but did not tolerate after 2 cycles.  Patient has been following with her oncologist at [facility] and recent CT scan showed patient had worsening of her metastatic lesions distributed in her bones and patient declined further chemotherapy.  Oncology recently started her on palliative care with high-dose narcotics.  Follows with outpatient palliative care clinic who manages her pain, but only has been seeing them via phone visits.   Roughly 4 weeks ago, patient had worsening shortness of breath and back pain, initially presented to [facility] ED on July 24 with CT angiogram negative for PE.   EDP consulted [facility] for further  evaluation and treatment of intractable pain of malignancy, hypercalcemia and constipation.  Hospital course:  Hypercalcemia of malignancy: Resolved Upon presentation, patient's calcium noted to be elevated at 12.3.  Patient received dose of Zometa 4 mg IV on 8/16.  Calcium improved during hospitalization and was 8.4 at time of discharge.  Recommend repeat BMP 1 week.   Stage IV metastatic breast cancer Followed by Dr. Merrie Roof at [facility]. Was referred to see radiation oncology in July 2022 but patient declined. Looking at phone records 12/19/2020, patient declined chemo, immunotherapy, radiation and was subsequently referred to palliative pain management. Appointment scheduled at [facility] with Dr. Jana Hakim on 8/23 at 4 PM. Ultimately very poor/grim prognosis, palliative care following with recommendations of outpatient palliative care on discharge   Cancer associated pain Patient with imaging notable for widespread osseous metastatic disease with lytic lesions at most vertebral levels, numerous bilateral rib lesions, nonacute T5 superior endplate fracture, pelvis notable for left posterior ilium, right sacral ala, left ischial tuberosity metastases with extraosseous tumor extension.  Started on fentanyl patch 12.5 mcg every 72 hours, continue oxycodone 10 mg as needed for breakthrough pain.  Palliative care to follow outpatient.   Anxiety: Ativan 0.5 mg p.o. q4h prn   Constipation Abdominal pain Acute abdominal series 8/19 with no evidence of dilated bowel loops or free intraperitoneal air, no significant radiographic abnormality other than no numerous lytic lesions throughout the axial/appendicular skeleton including large lesions involving the L1 and L4 vertebral bodies. Senokot-as 2 tablets twice daily, MiraLAX twice daily, Bisacodyl suppository 10 mg daily as needed   HIV Followed by infectious disease, Dr. Otilio Miu at [facility] hospital. [place]   Hx DVT  2020 Patient reports treated  with Xarelto for 1 month then discontinued.   Moderate protein calorie malnutrition Body mass index is 17.75 kg/m. Nutrition Status: Nutrition Problem: Moderate Malnutrition Etiology: chronic illness, cancer and cancer related treatments Signs/Symptoms: moderate fat depletion, mild muscle depletion, percent weight loss, energy intake < or equal to 75% for > or equal to 1 month Interventions: Ensure Enlive (each supplement provides 350kcal and 20 grams of protein), [facility] Dietitian was consulted and followed during hospital course.  Continue to encourage increase oral intake.  Discharge Diagnoses:  Active Problems:   Malnutrition of moderate degree   Metastatic malignant neoplasm [facility])   Palliative care patient    Discharge Instructions  Discharge Instructions     Ambulatory referral to Hematology / Oncology   Complete by: As directed    Call MD for:  difficulty breathing, headache or visual disturbances   Complete by: As directed    Call MD for:  extreme fatigue   Complete by: As directed    Call MD for:  persistant dizziness or light-headedness   Complete by: As directed    Call MD for:  persistant nausea and vomiting   Complete by: As directed    Call MD for:  severe uncontrolled pain   Complete by: As directed    Call MD for:  temperature >100.4   Complete by: As directed    Diet - low sodium heart healthy   Complete by: As directed    Increase activity slowly   Complete by: As directed       Allergies as of 01/21/2021   No Known Allergies      Medication List     TAKE these medications    Biktarvy 50-200-25 MG Tabs tablet Generic drug: bictegravir-emtricitabine-tenofovir AF Take 1 tablet by mouth daily.   bisacodyl 10 MG suppository Commonly known as: DULCOLAX Place 1 suppository (10 mg total) rectally daily as needed for moderate constipation.   dexamethasone 4 MG tablet Commonly known as: DECADRON Take 4 mg by mouth 2  (two) times daily with a meal.   fentaNYL 12 MCG/HR Commonly known as: [place] 1 patch onto the skin every 3 (three) days.   LORazepam 0.5 MG tablet Commonly known as: ATIVAN Take 1 tablet (0.5 mg total) by mouth every 4 (four) hours as needed for anxiety.   Oxycodone HCl 10 MG Tabs Take 1 tablet (10 mg total) by mouth every 6 (six) hours as needed for severe pain.   polyethylene glycol 17 g packet Commonly known as: MIRALAX / GLYCOLAX Take 17 g by mouth 2 (two) times daily.   senna-docusate 8.6-50 MG tablet Commonly known as: Senokot-S Take 2 tablets by mouth 2 (two) times daily.               Durable Medical Equipment  (From admission, onward)           Start     Ordered   01/21/21 1045  For home use only DME Walker rolling  Once       Question Answer Comment  Walker: With 5 Inch Wheels   Patient needs a walker to treat with the following condition Gait disorder      01/21/21 1046   01/20/21 1520  For home use only DME Walker rolling  Once       Question Answer Comment  Walker: With [place]   Patient needs a walker to treat with the following condition Gait disturbance      01/20/21 1521  Follow-up Information     Magrinat, Virgie Dad, MD. Go on 01/22/2021.   Specialty: Oncology Why: 4:00pm Contact information: [place] [address] [phone]                No Known Allergies  Consultations: Palliative care   Procedures/Studies: DG Chest 2 View  Result Date: 01/14/2021 CLINICAL DATA:  Shortness of breath EXAM: CHEST - 2 VIEW COMPARISON:  CT chest dated December 23, 2020 FINDINGS: Cardiac and mediastinal contours unchanged and within normal limits. Bibasilar atelectasis. Lungs otherwise clear. No evidence of pleural effusion or pneumothorax. Scattered lytic osseous lesions mild deviation of the left paraspinal line, compatible with known osseous metastatic disease. IMPRESSION: No acute  cardiopulmonary disease. Electronically Signed   By: Yetta Glassman M.D.   On: 01/14/2021 12:13   CT Angio Chest PE W/Cm &/Or Wo Cm  Result Date: 01/14/2021 CLINICAL DATA:  Metastatic breast cancer with recent progression. Shortness of breath for 2 days. Chest pain starting last night. EXAM: CT ANGIOGRAPHY CHEST WITH CONTRAST TECHNIQUE: Multidetector CT imaging of the chest was performed using the standard protocol during bolus administration of intravenous contrast. Multiplanar CT image reconstructions and MIPs were obtained to evaluate the vascular anatomy. CONTRAST:  98m OMNIPAQUE IOHEXOL 350 MG/ML SOLN COMPARISON:  12/23/2020 FINDINGS: Cardiovascular: No filling defect is identified in the pulmonary arterial tree to suggest pulmonary embolus. Mild aortic arch atherosclerotic calcification. Mediastinum/Nodes: Right infrahilar node 0.7 cm in short axis on image 69 series 6, stable. Similar sized left infrahilar lymph node. Scattered small left axillary lymph nodes. Lungs/Pleura: Increased subsegmental atelectasis in the posterior basal segment of the right lower lobe. Mild scarring or atelectasis medially in the left lower lobe. Mild lingular scarring. Upper Abdomen: The hepatic metastatic lesions seen on 12/23/2020 are poorly appreciated on today's CTA chest, possibly related to the phase of contrast. A dominant lesion posteriorly in the dome of the right hepatic lobe is only very faintly appreciable on image 97 series 6. Musculoskeletal: Rapidly progressive widespread lytic metastatic disease throughout the chest including multiple lytic rib lesions which have worsened in addition to numerous lesions in the thoracic spine which have enlarged from 12/23/2020. The largest spinal lesion continues to be the large geographic vertebral lesion at L1 with right posterior element involvement and suspected epidural tumor involvement. An index lesion at T10 measures 1.9 by 1.5 cm on image 72 series 10, previously  1.2 by 1.2 cm. Cutaneous thickening in both breasts is stable. Review of the MIP images confirms the above findings. IMPRESSION: 1. No filling defect is identified in the pulmonary arterial tree to suggest pulmonary embolus. 2. Further progression of lytic metastatic lesions with lytic destructive rib lesions and vertebral lesions. Of the various scattered lytic spine lesions, the largest is at the L1-2 level with the mass involving the vertebral body and right posterior elements and potentially substantially extending into the spinal canal/epidural space. 3. The hepatic metastatic lesions are poorly seen on today's CTA chest, possibly related to the phase of contrast. 4.  Aortic Atherosclerosis (ICD10-I70.0). Electronically Signed   By: WVan ClinesM.D.   On: 01/14/2021 17:05   CT Angio Chest Pulmonary Embolism (PE) W or WO Contrast  Result Date: 12/23/2020 CLINICAL DATA:  Acute nonlocalized abdominal pain. Left-sided flank and back pain. Stage IV breast cancer. EXAM: CT ANGIOGRAPHY CHEST CT ABDOMEN AND PELVIS WITH CONTRAST TECHNIQUE: Multidetector CT imaging of the chest was performed using the standard protocol during bolus administration of intravenous contrast.  Multiplanar CT image reconstructions and MIPs were obtained to evaluate the vascular anatomy. Multidetector CT imaging of the abdomen and pelvis was performed using the standard protocol during bolus administration of intravenous contrast. CONTRAST:  56m OMNIPAQUE IOHEXOL 350 MG/ML SOLN COMPARISON:  08/29/2020 FINDINGS: CTA CHEST FINDINGS Cardiovascular: Satisfactory opacification of the pulmonary arteries to the segmental level. No evidence of pulmonary embolism. Normal heart size. No pericardial effusion. Mediastinum/Nodes: Negative for mediastinal adenopathy. Mild hilar nodal prominence not meeting pathologic measurement threshold. Bilateral breast skin thickening which could be tumoral or treatment related. Left axillary nodes show  borderline size increased from before. Lungs/Pleura: Dependent atelectasis that is mild. Musculoskeletal: Widespread osseous metastatic disease with lytic lesions seen at most vertebral levels. Numerous bilateral rib lesions are seen. Nonacute appearing T5 superior endplate fracture. Review of the MIP images confirms the above findings. CT ABDOMEN and PELVIS FINDINGS Hepatobiliary: New ill-defined lesions in the liver with the largest seen in the right lobe at 15 mm. No evidence of biliary obstruction or inflammation Pancreas: Negative Spleen: Negative Adrenals/Urinary Tract: Adrenal glands are unremarkable. Kidneys are normal, without renal calculi, focal lesion, or hydronephrosis. Bladder is unremarkable. Stomach/Bowel: No evidence of obstruction or inflammation. Fecalization of terminal ileal contents attributed to slow transit. Vascular/Lymphatic: There are prominent retroperitoneal lymph nodes but not definitely pathologic/metastatic Reproductive: Calcification in the right adnexa, incidental. Other: No ascites or pneumoperitoneum Musculoskeletal: Numerous skeletal metastases with large transcortical lesions seen at multiple levels in the lumbar spine and pelvis. There was dedicated lumbar spine reformats that are described separately. In the pelvis there is notable left posterior ilium, right sacral ala, and left ischial tuberosity metastases with extraosseous tumor extension. Review of the MIP images confirms the above findings. IMPRESSION: 1. Negative for pulmonary embolism. No acute intra-abdominal finding. 2. Progressive metastatic disease since March 2022, now advanced and widespread in the skeleton and newly seen in the liver. Electronically Signed   By: JMonte FantasiaM.D.   On: 12/23/2020 07:07   CT ABDOMEN PELVIS W CONTRAST  Result Date: 12/23/2020 CLINICAL DATA:  Acute nonlocalized abdominal pain. Left-sided flank and back pain. Stage IV breast cancer. EXAM: CT ANGIOGRAPHY CHEST CT ABDOMEN AND  PELVIS WITH CONTRAST TECHNIQUE: Multidetector CT imaging of the chest was performed using the standard protocol during bolus administration of intravenous contrast. Multiplanar CT image reconstructions and MIPs were obtained to evaluate the vascular anatomy. Multidetector CT imaging of the abdomen and pelvis was performed using the standard protocol during bolus administration of intravenous contrast. CONTRAST:  851mOMNIPAQUE IOHEXOL 350 MG/ML SOLN COMPARISON:  08/29/2020 FINDINGS: CTA CHEST FINDINGS Cardiovascular: Satisfactory opacification of the pulmonary arteries to the segmental level. No evidence of pulmonary embolism. Normal heart size. No pericardial effusion. Mediastinum/Nodes: Negative for mediastinal adenopathy. Mild hilar nodal prominence not meeting pathologic measurement threshold. Bilateral breast skin thickening which could be tumoral or treatment related. Left axillary nodes show borderline size increased from before. Lungs/Pleura: Dependent atelectasis that is mild. Musculoskeletal: Widespread osseous metastatic disease with lytic lesions seen at most vertebral levels. Numerous bilateral rib lesions are seen. Nonacute appearing T5 superior endplate fracture. Review of the MIP images confirms the above findings. CT ABDOMEN and PELVIS FINDINGS Hepatobiliary: New ill-defined lesions in the liver with the largest seen in the right lobe at 15 mm. No evidence of biliary obstruction or inflammation Pancreas: Negative Spleen: Negative Adrenals/Urinary Tract: Adrenal glands are unremarkable. Kidneys are normal, without renal calculi, focal lesion, or hydronephrosis. Bladder is unremarkable. Stomach/Bowel: No evidence of obstruction or inflammation.  Fecalization of terminal ileal contents attributed to slow transit. Vascular/Lymphatic: There are prominent retroperitoneal lymph nodes but not definitely pathologic/metastatic Reproductive: Calcification in the right adnexa, incidental. Other: No ascites or  pneumoperitoneum Musculoskeletal: Numerous skeletal metastases with large transcortical lesions seen at multiple levels in the lumbar spine and pelvis. There was dedicated lumbar spine reformats that are described separately. In the pelvis there is notable left posterior ilium, right sacral ala, and left ischial tuberosity metastases with extraosseous tumor extension. Review of the MIP images confirms the above findings. IMPRESSION: 1. Negative for pulmonary embolism. No acute intra-abdominal finding. 2. Progressive metastatic disease since March 2022, now advanced and widespread in the skeleton and newly seen in the liver. Electronically Signed   By: Monte Fantasia M.D.   On: 12/23/2020 07:07   CT L-SPINE NO CHARGE  Result Date: 12/23/2020 CLINICAL DATA:  Left-sided flank and back pain. History of breast cancer EXAM: CT Lumbar Spine with contrast TECHNIQUE: Technique: Multiplanar CT images of the lumbar spine were reconstructed from contemporary CT of the Abdomen and Pelvis. CONTRAST:  None additional COMPARISON:  Abdominal CT 08/29/2020 FINDINGS: Segmentation: 5 lumbar type vertebrae Alignment: No traumatic malalignment Vertebrae: Marked progression of primarily lytic bony metastases with involvement seen at each level from T12 to S1. Superior endplate fracture at S1, pathologic. There is extensive cortical loss at L4 with new mild wedging. Notably large metastasis in the right-sided L1 body and posterior elements with epidural tumor and foraminal effacement at T12-L1 more than L1-2. L4 right posterior body and pedicle lesion with early epidural extension. Large left-sided S1 body mass infiltrates the left anterior sacral foramen with mass effect on the left S1 nerve root. Bilateral extensive pelvic bony erosion. A right sacral ala ventral cortex erosion is contiguous with the lumbar plexus. Paraspinal and other soft tissues: Reported separately Disc levels: No significant contributory degenerative changes.  IMPRESSION: 1. Advanced osseous metastatic disease with marked progression from March 2022. 2. Pathologic fractures of the L4 and S1 vertebral bodies with mild height loss. 3. Epidural tumor at L1, L4, and S1. The L1 and S1 masses impinge on exiting nerve roots. Electronically Signed   By: Monte Fantasia M.D.   On: 12/23/2020 06:57   DG ABD ACUTE 2+V W 1V CHEST  Result Date: 01/18/2021 CLINICAL DATA:  Abdominal distension, abdominal pain. Breast cancer. EXAM: DG ABDOMEN ACUTE WITH 1 VIEW CHEST COMPARISON:  12/23/2020, 01/14/2021 FINDINGS: There is no evidence of dilated bowel loops or free intraperitoneal air. No radiopaque calculi or other significant radiographic abnormality is seen. Heart size and mediastinal contours are within normal limits. Both lungs are clear. Numerous lytic lesions throughout the axial and appendicular skeleton including large lesions involving the L1 and L4 vertebral bodies. IMPRESSION: 1. Negative abdominal radiographs. No acute cardiopulmonary disease. 2. Widespread osseous metastatic disease, as seen on recent CT examinations. Electronically Signed   By: Davina Poke D.O.   On: 01/18/2021 13:43     Subjective: Patient seen examined at bedside, resting comfortably.  Pain controlled.  Ready for discharge home today.  Has appointment scheduled with Dr. Jana Hakim tomorrow afternoon.  No other questions or concerns at this time.  Denies headache, no chest pain, no palpitations, no shortness of breath, no abdominal pain.  No acute events overnight per nursing staff.  Discharge Exam: Vitals:   01/21/21 0358 01/21/21 0810  BP: 107/66 106/60  Pulse: (!) 109 (!) 104  Resp: 20 20  Temp: 99.1 F (37.3 C) 99.5 F (37.5 C)  SpO2: 99% 99%   Vitals:   01/20/21 2006 01/21/21 0102 01/21/21 0358 01/21/21 0810  BP: 118/69 106/69 107/66 106/60  Pulse: (!) 125 (!) 114 (!) 109 (!) 104  Resp: '18 18 20 20  '$ Temp: 99.6 F (37.6 C) (!) 97.5 F (36.4 C) 99.1 F (37.3 C) 99.5 F  (37.5 C)  TempSrc: Oral Oral Oral Oral  SpO2: 97% 99% 99% 99%  Weight:      Height:        General: Pt is alert, awake, not in acute distress, chronically ill/cachectic/thin in appearance with significant muscle/fat loss Cardiovascular: RRR, S1/S2 +, no rubs, no gallops Respiratory: CTA bilaterally, no wheezing, no rhonchi, on room air Abdominal: Soft, NT, ND, bowel sounds + Extremities: no edema, no cyanosis    The results of significant diagnostics from this hospitalization (including imaging, microbiology, ancillary and laboratory) are listed below for reference.     Microbiology: Recent Results (from the past 240 hour(s))  SARS CORONAVIRUS 2 (TAT 6-24 HRS) Nasopharyngeal Nasopharyngeal Swab     Status: None   Collection Time: 01/14/21  2:27 PM   Specimen: Nasopharyngeal Swab  Result Value Ref Range Status   SARS Coronavirus 2 NEGATIVE NEGATIVE Final    Comment: (NOTE) SARS-CoV-2 target nucleic acids are NOT DETECTED.  The SARS-CoV-2 RNA is generally detectable in upper and lower respiratory specimens during the acute phase of infection. Negative results do not preclude SARS-CoV-2 infection, do not rule out co-infections with other pathogens, and should not be used as the sole basis for treatment or other patient management decisions. Negative results must be combined with clinical observations, patient history, and epidemiological information. The expected result is Negative.  Fact Sheet for Patients: SugarRoll.be  Fact Sheet for Healthcare Providers: https://www.woods-mathews.com/  This test is not yet approved or cleared by the [place] FDA and  has been authorized for detection and/or diagnosis of SARS-CoV-2 by FDA under an Emergency Use Authorization (EUA). This EUA will remain  in effect (meaning this test can be used) for the duration of the COVID-19 declaration under Se ction 564(b)(1) of the Act, 21  U.S.C. section 360bbb-3(b)(1), unless the authorization is terminated or revoked sooner.  Performed at [facility], [place] [address], [place], [place] [address]   Urine Culture     Status: Abnormal   Collection Time: 01/18/21  6:48 AM   Specimen: Urine, Clean Catch  Result Value Ref Range Status   Specimen Description   Final    URINE, CLEAN CATCH Performed at [facility], [place] [address] [place], [place] [address]    Special Requests   Final    NONE Performed at [facility], [place] [address] [place], [place] [address]    Culture (A)  Final    <10,000 COLONIES/mL INSIGNIFICANT GROWTH Performed at [place] [address], [place], [place] [address]    Report Status 01/20/2021 FINAL  Final     Labs: BNP (last 3 results) Recent Labs    01/14/21 1135  BNP [id number]   Basic Metabolic Panel: Recent Labs  Lab 01/17/21 0945 01/17/21 2023 01/18/21 0528 01/19/21 0524 01/21/21 0439  NA 139 136 134* 136 133*  K 2.9* 3.8 3.8 3.8 3.8  CL 100 99 100 102 99  CO2 '30 28 25 27 28  '$ GLUCOSE 100* 103* 118* 92 101*  BUN '14 10 9 10 10  '$ CREATININE 0.68 0.62 0.54 0.51 0.58  CALCIUM 10.3 9.1 9.3 8.3* 8.4*  MG 1.7  --  2.2  --  2.1   Liver Function Tests: Recent Labs  Lab 01/14/21 1335  AST 45*  ALT 18  ALKPHOS 143*  BILITOT 1.1  PROT 8.5*  ALBUMIN 3.7   No results for input(s): LIPASE, AMYLASE in the last 168 hours. No results for input(s): AMMONIA in the last 168 hours. CBC: Recent Labs  Lab 01/14/21 1335 01/18/21 0630 01/19/21 0524  WBC 7.8 9.5 7.3  NEUTROABS 5.4  --   --   HGB 11.3* 9.9* 8.9*  HCT 34.0* 30.6* 27.2*  MCV 85.6 86.7 87.7  PLT 188 223 206   Cardiac Enzymes: No results for input(s): CKTOTAL, CKMB, CKMBINDEX, TROPONINI in the last 168 hours. BNP: Invalid input(s): POCBNP CBG: No results for input(s): GLUCAP in the last 168 hours. D-Dimer No results for input(s): DDIMER in the last 72 hours. Hgb  A1c No results for input(s): HGBA1C in the last 72 hours. Lipid Profile No results for input(s): CHOL, HDL, LDLCALC, TRIG, CHOLHDL, LDLDIRECT in the last 72 hours. Thyroid function studies No results for input(s): TSH, T4TOTAL, T3FREE, THYROIDAB in the last 72 hours.  Invalid input(s): FREET3 Anemia work up No results for input(s): VITAMINB12, FOLATE, FERRITIN, TIBC, IRON, RETICCTPCT in the last 72 hours. Urinalysis    Component Value Date/Time   COLORURINE YELLOW 01/18/2021 0648   APPEARANCEUR CLEAR 01/18/2021 0648   LABSPEC 1.015 01/18/2021 0648   PHURINE 6.0 01/18/2021 0648   GLUCOSEU NEGATIVE 01/18/2021 0648   HGBUR NEGATIVE 01/18/2021 0648   [place] 01/18/2021 0648   [place] 01/18/2021 0648   PROTEINUR NEGATIVE 01/18/2021 0648   NITRITE NEGATIVE 01/18/2021 0648   LEUKOCYTESUR NEGATIVE 01/18/2021 0648   Sepsis Labs Invalid input(s): PROCALCITONIN,  WBC,  LACTICIDVEN Microbiology Recent Results (from the past 240 hour(s))  SARS CORONAVIRUS 2 (TAT 6-24 HRS) Nasopharyngeal Nasopharyngeal Swab     Status: None   Collection Time: 01/14/21  2:27 PM   Specimen: Nasopharyngeal Swab  Result Value Ref Range Status   SARS Coronavirus 2 NEGATIVE NEGATIVE Final    Comment: (NOTE) SARS-CoV-2 target nucleic acids are NOT DETECTED.  The SARS-CoV-2 RNA is generally detectable in upper and lower respiratory specimens during the acute phase of infection. Negative results do not preclude SARS-CoV-2 infection, do not rule out co-infections with other pathogens, and should not be used as the sole basis for treatment or other patient management decisions. Negative results must be combined with clinical observations, patient history, and epidemiological information. The expected result is Negative.  Fact Sheet for Patients: SugarRoll.be  Fact Sheet for Healthcare Providers: https://www.woods-mathews.com/  This test is not  yet approved or cleared by the [place] FDA and  has been authorized for detection and/or diagnosis of SARS-CoV-2 by FDA under an Emergency Use Authorization (EUA). This EUA will remain  in effect (meaning this test can be used) for the duration of the COVID-19 declaration under Se ction 564(b)(1) of the Act, 21 U.S.C. section 360bbb-3(b)(1), unless the authorization is terminated or revoked sooner.  Performed at [facility], [place] [address] [place], [place] [address]   Urine Culture     Status: Abnormal   Collection Time: 01/18/21  6:48 AM   Specimen: Urine, Clean Catch  Result Value Ref Range Status   Specimen Description   Final    URINE, CLEAN CATCH Performed at [facility], [place] [address], [place], [place] [address]    Special Requests   Final  NONE Performed at [facility], [place] [address], [place], [place] [address]    Culture (A)  Final    <10,000 COLONIES/mL INSIGNIFICANT GROWTH Performed at [facility] [address], [place], [place] [address]    Report Status 01/20/2021 FINAL  Final     Time coordinating discharge: Over 30 minutes  SIGNED:   Yumna Ebers J [place] DO  [facility] 01/21/2021, 10:54 AM

## 2021-01-21 NOTE — Progress Notes (Signed)
[facility] ([facility])  Hospital Liaison: RN note         This patient has been referred to our palliative care services in the community.  [facility] will continue to follow for any discharge planning needs and to coordinate continuation of palliative care in the outpatient setting.    If you have questions or need assistance, please call [phone] or contact the hospital Liaison listed on AMION.      Thank you for this referral.         Mary Anne Robertson, RN, CCM  [facility] Hospital Liaison   [phone] 

## 2021-01-21 NOTE — Progress Notes (Signed)
Occupational Therapy Treatment Patient Details Name: Ashley Ortega MRN: [id number] DOB: [birth year] Today's Date: 01/21/2021    History of present illness Ashley Ortega is a 50 y.o. 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 Recent CT scan showed patient has had worsening of metastatic lesion diffused distributed in her bones started on palliative care with high-dose of narcotics.   OT comments  Patient was educated on total hip kit and increasing independence in LB dressing tasks. Patient was able to don sock with min A with increased time to increase independence in ADLs. Patient was educated on participating in LB dressing tasks with reacher and long handled shoe horn. Patient verbalized understanding. Patient simulated donning pants with pillow case and reacher to increase independence in ADLs. Patient's discharge plan remains appropriate at this time. OT will continue to follow acutely.    Follow Up Recommendations  Home health OT;Supervision/Assistance - 24 hour    Equipment Recommendations  Other (comment) (total hip kit)    Recommendations for Other Services      Precautions / Restrictions Precautions Precautions: Fall Restrictions Weight Bearing Restrictions: No       Mobility Bed Mobility               General bed mobility comments: patient was seated in recliner during session.    Transfers                      Balance                                           ADL either performed or assessed with clinical judgement   ADL                       Lower Body Dressing: Moderate assistance;Sitting/lateral leans Lower Body Dressing Details (indicate cue type and reason): patient was mod A for LB Dressing with education on using AE. patient's family  present in room with education on continued practice with youtube links recommended. patient and family verbalized understanding. patients level of back pain was impacting participation at this time.               General ADL Comments: patient provided with total hip kit with education on how to use each device. patient verbalized understanding.     Vision Patient Visual Report: No change from baseline     Perception     Praxis      Cognition Arousal/Alertness: Awake/alert Behavior During Therapy: WFL for tasks assessed/performed Overall Cognitive Status: Within Functional Limits for tasks assessed                                          Exercises     Shoulder Instructions       General Comments      Pertinent Vitals/ Pain       Pain Assessment: Faces Faces Pain Scale: Hurts even more Pain Location: low back Pain Descriptors / Indicators: Aching;Sore Pain Intervention(s): Limited activity within patient's tolerance;Monitored during session  Home Living  Prior Functioning/Environment              Frequency  Min 2X/week        Progress Toward Goals  OT Goals(current goals can now be found in the care plan section)  Progress towards OT goals: Progressing toward goals  Acute Rehab OT Goals Patient Stated Goal: to get pain better  Plan Discharge plan remains appropriate    Co-evaluation                 AM-PAC OT "6 Clicks" Daily Activity     Outcome Measure   Help from another person eating meals?: None Help from another person taking care of personal grooming?: A Little Help from another person toileting, which includes using toliet, bedpan, or urinal?: A Little Help from another person bathing (including washing, rinsing, drying)?: A Little Help from another person to put on and taking off regular upper body clothing?: A Little Help from another person to put on  and taking off regular lower body clothing?: A Lot 6 Click Score: 18    End of Session Equipment Utilized During Treatment: Other (comment) (total hip kit)  OT Visit Diagnosis: Unsteadiness on feet (R26.81);Other abnormalities of gait and mobility (R26.89);Pain Pain - part of body:  (low back)   Activity Tolerance Patient tolerated treatment well   Patient Left in chair;with call bell/phone within reach;with family/visitor present   Nurse Communication          Time: 1610-9604 OT Time Calculation (min): 13 min  Charges: OT General Charges $OT Visit: 1 Visit OT Treatments $Self Care/Home Management : 8-22 mins  Jackelyn Poling OTR/L, MS Acute Rehabilitation Department Office# [phone] Pager# [phone]    [place] 01/21/2021, 11:52 AM

## 2021-01-21 NOTE — TOC Transition Note (Signed)
Transition of Care [facility]) - CM/SW Discharge Note   Patient Details  Name: Ashley Ortega MRN: [id number] Date of Birth: 16-Oct-1970  Transition of Care [facility]) CM/SW Contact:  Ross Ludwig, LCSW Phone Number: 01/21/2021, 12:32 PM   Clinical Narrative:     CSW spoke to patient's sister and provided choice for home health services and outpatient palliative.  She did not have a preference, [facility] agreed to accept patient, and [facility] for outpatient palliative.  CSW to sign off, patient will be discharging today.   Final next level of care: [place] Barriers to Discharge: Barriers Resolved   Patient Goals and CMS Choice Patient states their goals for this hospitalization and ongoing recovery are:: To return back home with home health services and outpatient palliative. CMS Medicare.gov Compare Post Acute Care list provided to:: Patient Represenative (must comment) Choice offered to / list presented to : Sibling  Discharge Placement                       Discharge Plan and Services                DME Arranged: [place] rolling DME Agency: [employer] Date DME Agency Contacted: 01/21/21 Time DME Agency Contacted: [phone] Representative spoke with at DME Agency: Freda Munro HH Arranged: PT [place]: Charles Date [place]: 01/21/21 Time [place]: 1232 Representative spoke with at [place]: Cindie  Social Determinants of Health ([place]) Interventions     Readmission Risk Interventions No flowsheet data found.

## 2021-01-22 ENCOUNTER — Inpatient Hospital Stay: Payer: Medicaid Other | Attending: Oncology | Admitting: Oncology

## 2021-01-22 ENCOUNTER — Inpatient Hospital Stay: Payer: Medicaid Other

## 2021-01-22 DIAGNOSIS — G629 Polyneuropathy, unspecified: Secondary | ICD-10-CM | POA: Insufficient documentation

## 2021-01-22 DIAGNOSIS — C787 Secondary malignant neoplasm of liver and intrahepatic bile duct: Secondary | ICD-10-CM | POA: Insufficient documentation

## 2021-01-22 DIAGNOSIS — C50911 Malignant neoplasm of unspecified site of right female breast: Secondary | ICD-10-CM | POA: Insufficient documentation

## 2021-01-22 DIAGNOSIS — C7951 Secondary malignant neoplasm of bone: Secondary | ICD-10-CM | POA: Insufficient documentation

## 2021-01-22 DIAGNOSIS — Z171 Estrogen receptor negative status [ER-]: Secondary | ICD-10-CM | POA: Insufficient documentation

## 2021-01-22 DIAGNOSIS — Z79899 Other long term (current) drug therapy: Secondary | ICD-10-CM | POA: Insufficient documentation

## 2021-01-23 ENCOUNTER — Telehealth: Payer: Self-pay | Admitting: *Deleted

## 2021-01-23 ENCOUNTER — Encounter: Payer: Self-pay | Admitting: Licensed Clinical Social Worker

## 2021-01-23 ENCOUNTER — Inpatient Hospital Stay (HOSPITAL_BASED_OUTPATIENT_CLINIC_OR_DEPARTMENT_OTHER): Payer: Medicaid Other | Admitting: Hematology and Oncology

## 2021-01-23 ENCOUNTER — Other Ambulatory Visit: Payer: Self-pay

## 2021-01-23 DIAGNOSIS — G629 Polyneuropathy, unspecified: Secondary | ICD-10-CM | POA: Diagnosis not present

## 2021-01-23 DIAGNOSIS — C50919 Malignant neoplasm of unspecified site of unspecified female breast: Secondary | ICD-10-CM | POA: Insufficient documentation

## 2021-01-23 DIAGNOSIS — Z79899 Other long term (current) drug therapy: Secondary | ICD-10-CM

## 2021-01-23 DIAGNOSIS — Z171 Estrogen receptor negative status [ER-]: Secondary | ICD-10-CM | POA: Diagnosis not present

## 2021-01-23 DIAGNOSIS — C7951 Secondary malignant neoplasm of bone: Secondary | ICD-10-CM

## 2021-01-23 DIAGNOSIS — C50911 Malignant neoplasm of unspecified site of right female breast: Secondary | ICD-10-CM

## 2021-01-23 DIAGNOSIS — C787 Secondary malignant neoplasm of liver and intrahepatic bile duct: Secondary | ICD-10-CM | POA: Diagnosis not present

## 2021-01-23 MED ORDER — FENTANYL 25 MCG/HR TD PT72: 1.0000 | MEDICATED_PATCH | TRANSDERMAL | 0 refills | Status: DC

## 2021-01-23 NOTE — Telephone Encounter (Signed)
Per communication with the patient's daughter - Ashley Ortega - pt unable to come in for NP visit 8/23 due to pain.  Dr Lindi Adie can see her today at 1 pm- informed family with agreement.  Isaiah Blakes notified of the above.

## 2021-01-23 NOTE — Assessment & Plan Note (Signed)
Metastatic breast cancer: Bone metastases and liver metastases CT abdomen and pelvis 12/23/2020: Progressive metastatic disease widespread in the skeleton and new lesions in the liver CT chest 01/14/2021: Progression of lytic metastatic lesions in the rib and vertebral, hepatic metastasis  Recommendation: 1.  Ultrasound-guided liver biopsy to review the prognostic panel as well as Caris molecular testing 2. patient is not interested in chemotherapy.  Therefore we will assess to see if any thing changed with regards to her pathology.  3.  I provided her with literature on capecitabine. If she is agreeable then we may consider treating her with 1000 mg p.o. twice daily 14 days on 1 week off.  Return to clinic after the biopsy to discuss results and initiate therapy if she is agreeable.

## 2021-01-23 NOTE — Progress Notes (Signed)
[place] CONSULT NOTE  Patient Care Team: Pcp, No as PCP - General  CHIEF COMPLAINTS/PURPOSE OF CONSULTATION:  Metastatic breast cancer, establishing care  HISTORY OF PRESENTING ILLNESS:  Ashley Ortega 50 y.o. female is here because of prior history of metastatic breast cancer.  She was originally diagnosed with stage III breast cancer triple negative disease in 2020.  After 2 cycles of neoadjuvant dose dense Adriamycin and Cytoxan she quit treatment and went a holistic approach.  She then moved to [facility] and sought [facility] at [facility].  She underwent additional biopsies which confirmed triple negative breast cancer and she was given 1 dose of Taxol.  She tells me that she developed profound neuropathy and decided to discontinue it.  She wanted to come to [place] to discuss if there are any known chemotherapy options.  She was in the hospital emergency room complaining of intractable pain issues and that she was noted to have diffuse bone metastases.  CT of the abdomen pelvis revealed liver metastasis as well.  I reviewed her records extensively and collaborated the history with the patient.  SUMMARY OF ONCOLOGIC HISTORY: Oncology History  Metastatic breast cancer ([place])  10/2018 Initial Diagnosis   Locally advanced right breast cancer triple negative, status post 2 cycles of neoadjuvant dose dense Adriamycin and Cytoxan, lost to follow-up (holistic therapy), initially followed by Dr. Cecil Cranker at [facility] cancer care   09/04/2020 Relapse/Recurrence   Bilateral breast cancers with diffuse osseous metastases Left breast: Grade 2 IDC ER 5%, PR less than 1%, HER2 negative, Ki-67 40% Right breast: Grade 2 IDC, ER less than 1%, PR less than 1%, HER2 negative, Ki-67 70% Left axilla lymph node biopsy: Metastatic carcinoma 12/13/2020: MRI thoracic spine: Innumerable bone lesions Followed by Dr. Merrie Roof    09/25/2020 -  Chemotherapy   Palliative chemotherapy with Taxol  discontinued after 1 cycle       MEDICAL HISTORY:  Metastatic breast cancer  SURGICAL HISTORY: No prior surgeries  SOCIAL HISTORY: Denies any tobacco: Recreational drug use FAMILY HISTORY: No family history of breast cancer    ALLERGIES:  has No Known Allergies.  MEDICATIONS:  Current Outpatient Medications  Medication Sig Dispense Refill   fentaNYL (DURAGESIC) 25 MCG/HR Place 1 patch onto the skin every 3 (three) days. 10 patch 0   bictegravir-emtricitabine-tenofovir AF (BIKTARVY) 50-200-25 MG TABS tablet Take 1 tablet by mouth daily.     bisacodyl (DULCOLAX) 10 MG suppository Place 1 suppository (10 mg total) rectally daily as needed for moderate constipation. 12 suppository 2   LORazepam (ATIVAN) 0.5 MG tablet Take 1 tablet (0.5 mg total) by mouth every 4 (four) hours as needed for anxiety. 30 tablet 0   oxyCODONE 10 MG TABS Take 1 tablet (10 mg total) by mouth every 6 (six) hours as needed for severe pain. 20 tablet 0   polyethylene glycol (MIRALAX / GLYCOLAX) 17 g packet Take 17 g by mouth 2 (two) times daily. 60 packet 2   senna-docusate (SENOKOT-S) 8.6-50 MG tablet Take 2 tablets by mouth 2 (two) times daily. 120 tablet 2   No current facility-administered medications for this visit.    PHYSICAL EXAMINATION: ECOG PERFORMANCE STATUS: 1 - Symptomatic but completely ambulatory  Vitals:   01/23/21 1351  BP: 125/70  Pulse: (!) 110  Resp: 18  Temp: 97.7 F (36.5 C)  SpO2: 99%   Filed Weights   01/23/21 1351  Weight: 109 lb (49.4 kg)   LABORATORY DATA:  I have reviewed  the data as listed Lab Results  Component Value Date   WBC 7.3 01/19/2021   HGB 8.9 (L) 01/19/2021   HCT 27.2 (L) 01/19/2021   MCV 87.7 01/19/2021   PLT 206 01/19/2021   Lab Results  Component Value Date   NA 133 (L) 01/21/2021   K 3.8 01/21/2021   CL 99 01/21/2021   CO2 28 01/21/2021    RADIOGRAPHIC STUDIES: I have personally reviewed the radiological reports and agreed with the  findings in the report.  ASSESSMENT AND PLAN:  Metastatic breast cancer ([place]) Metastatic breast cancer: Bone metastases and liver metastases CT abdomen and pelvis 12/23/2020: Progressive metastatic disease widespread in the skeleton and new lesions in the liver CT chest 01/14/2021: Progression of lytic metastatic lesions in the rib and vertebral, hepatic metastasis  Recommendation: 1.  Ultrasound-guided liver biopsy to review the prognostic panel as well as Caris molecular testing 2. patient is not interested in chemotherapy.  Therefore we will assess to see if any thing changed with regards to her pathology.  3.  I provided her with literature on capecitabine. If she is agreeable then we may consider treating her with 1000 mg p.o. twice daily 14 days on 1 week off.  Return to clinic after the biopsy to discuss results and initiate therapy if she is agreeable.   All questions were answered. The patient knows to call the clinic with any problems, questions or concerns.    Harriette Ohara, MD 01/23/21

## 2021-01-23 NOTE — Progress Notes (Signed)
Ashley Ortega Social Work  Clinical Social Work was referred by medical team  to review and complete healthcare advance directives.  Clinical Social Worker met with patient and pt's sister Ashley Maus "Shy") in exam room.  The patient designated [name] (sister) as their primary healthcare agent and [name] as their secondary agent.  Patient also completed healthcare living will.    Clinical Social Worker notarized documents and made copies for patient/family. Clinical Social Worker will send documents to medical records to be scanned into patient's chart. Clinical Social Worker encouraged patient/family to contact with any additional questions or concerns.  Patient with stage IV breast cancer. In significant pain today- has been referred to palliative care and home health services when inpatient. She lives with her sister.   [name] , MSW, LCSW Clinical Social Worker [facility] [phone]

## 2021-01-24 ENCOUNTER — Encounter (HOSPITAL_COMMUNITY): Payer: Self-pay | Admitting: Radiology

## 2021-01-24 NOTE — Progress Notes (Signed)
Patient Name  Ashley Ortega, Ashley Ortega Legal Sex  Female DOB  1970/07/05 SSN  [id number] Address  [address]  [place] [address] Phone  [phone] (Home)    RE: US BIOPSY (LIVER) Received: Today Suttle, Rosanne Ashing, MD  Garth Bigness D Approved for ultrasound guided liver mass biopsy.  Contrast enhanced ultrasound guidance would like be of benefit.   [place]        Previous Messages   ----- Message -----  From: Garth Bigness D  Sent: 01/23/2021   5:58 PM EDT  To: [place] Procedure Requests  Subject: US BIOPSY (LIVER)                               Procedure:  US BIOPSY (LIVER)   Reason:  Malignant neoplasm of female breast, unspecified estrogen receptor status, unspecified laterality, unspecified site of breast, Stage IV breast cancer   History:  CT, DG in computer   Provider:  Nicholas Lose   Provider Contact:  [phone]

## 2021-01-29 ENCOUNTER — Other Ambulatory Visit: Payer: Self-pay | Admitting: Hematology and Oncology

## 2021-01-29 ENCOUNTER — Telehealth: Payer: Self-pay | Admitting: *Deleted

## 2021-01-29 MED ORDER — OXYCODONE HCL 10 MG PO TABS: 10.0000 mg | ORAL_TABLET | Freq: Four times a day (QID) | ORAL | 0 refills | Status: DC | PRN

## 2021-01-29 NOTE — Telephone Encounter (Signed)
This RN received call from the patient's daughter- Angelica Pou - stating pt needed refill on pain medication.  This RN was able to speak with the patient herself.  She states she is wearing a 25 mcg fentanyl - and obtained a new box yesterday.  She states she is out of the oxycodone 10 mg and is using them " about every 3-4 hours ".  She is using colace and miralax for bowel prophylaxis with movement yesterday.  This RN asked pt usually due to current use of oxycodone- MD may want to increase the fentanyl dose.  She is agreeable to increase.  This RN informed her above will be reviewed with MD for further recommendations.

## 2021-01-29 NOTE — Telephone Encounter (Signed)
Per review with MD- recommendation is for the pt to apply another 25 mcg of fentanyl now- refill for the oxycodone is being sent to her pharmacy.  Discussed with her effects of pain relief with adding patch may not be noticed for approximately 12 hours.  She also may note that she is more sleepy tomorrow due to increased use- but this is transient.  This RN also reviewed goal to keep bowels soft and need to use miralax and senokot as directed with goal for BM at least every 2 days.  Ashley Ortega verbalized understanding.  No further needs at this time.

## 2021-01-29 NOTE — Progress Notes (Signed)
Encouraged the patient to increase fentanyl patch to 50 mcg Sent a new prescription for oxycodone 10 mg pills (60 tablets)

## 2021-01-31 ENCOUNTER — Telehealth: Payer: Self-pay

## 2021-01-31 NOTE — Telephone Encounter (Signed)
Monica, LCSW from [place] called and LVM informing us pt has been scheduled for initial palliative care visit 02/07/2021 at 1300. If there are any questions she may be reached at [phone]

## 2021-01-31 NOTE — Telephone Encounter (Signed)
(  3:01 pm) SW scheduled initial palliative care RN/SW visit with patient for 02/07/21 @ 1 pm..

## 2021-02-01 ENCOUNTER — Other Ambulatory Visit: Payer: Self-pay | Admitting: Radiology

## 2021-02-05 ENCOUNTER — Inpatient Hospital Stay (HOSPITAL_COMMUNITY)
Admission: EM | Admit: 2021-02-05 | Discharge: 2021-02-15 | DRG: 947 | Disposition: A | Discharge status: Hospice | Payer: Medicaid Other | Source: Ambulatory Visit | Attending: Internal Medicine | Admitting: Internal Medicine

## 2021-02-05 ENCOUNTER — Ambulatory Visit (HOSPITAL_COMMUNITY)
Admission: RE | Admit: 2021-02-05 | Discharge: 2021-02-05 | Disposition: A | Discharge status: Home / Self Care | Payer: Medicaid Other | Source: Ambulatory Visit | Attending: Hematology and Oncology | Admitting: Hematology and Oncology

## 2021-02-05 ENCOUNTER — Other Ambulatory Visit: Payer: Self-pay

## 2021-02-05 ENCOUNTER — Other Ambulatory Visit: Payer: Self-pay | Admitting: Hematology and Oncology

## 2021-02-05 ENCOUNTER — Emergency Department (HOSPITAL_COMMUNITY): Payer: Medicaid Other

## 2021-02-05 ENCOUNTER — Encounter (HOSPITAL_COMMUNITY): Payer: Self-pay | Admitting: *Deleted

## 2021-02-05 DIAGNOSIS — Z9221 Personal history of antineoplastic chemotherapy: Secondary | ICD-10-CM

## 2021-02-05 DIAGNOSIS — R630 Anorexia: Secondary | ICD-10-CM | POA: Diagnosis present

## 2021-02-05 DIAGNOSIS — Z170 Estrogen receptor positive status [ER+]: Secondary | ICD-10-CM

## 2021-02-05 DIAGNOSIS — Z515 Encounter for palliative care: Secondary | ICD-10-CM

## 2021-02-05 DIAGNOSIS — R627 Adult failure to thrive: Secondary | ICD-10-CM | POA: Diagnosis present

## 2021-02-05 DIAGNOSIS — C50919 Malignant neoplasm of unspecified site of unspecified female breast: Secondary | ICD-10-CM

## 2021-02-05 DIAGNOSIS — E43 Unspecified severe protein-calorie malnutrition: Secondary | ICD-10-CM | POA: Diagnosis present

## 2021-02-05 DIAGNOSIS — R509 Fever, unspecified: Secondary | ICD-10-CM

## 2021-02-05 DIAGNOSIS — B20 Human immunodeficiency virus [HIV] disease: Secondary | ICD-10-CM | POA: Diagnosis present

## 2021-02-05 DIAGNOSIS — Z681 Body mass index (BMI) 19 or less, adult: Secondary | ICD-10-CM

## 2021-02-05 DIAGNOSIS — J189 Pneumonia, unspecified organism: Secondary | ICD-10-CM | POA: Diagnosis present

## 2021-02-05 DIAGNOSIS — Z171 Estrogen receptor negative status [ER-]: Secondary | ICD-10-CM

## 2021-02-05 DIAGNOSIS — F419 Anxiety disorder, unspecified: Secondary | ICD-10-CM | POA: Diagnosis present

## 2021-02-05 DIAGNOSIS — R102 Pelvic and perineal pain: Secondary | ICD-10-CM

## 2021-02-05 DIAGNOSIS — R000 Tachycardia, unspecified: Secondary | ICD-10-CM | POA: Diagnosis present

## 2021-02-05 DIAGNOSIS — M7989 Other specified soft tissue disorders: Secondary | ICD-10-CM

## 2021-02-05 DIAGNOSIS — C7951 Secondary malignant neoplasm of bone: Secondary | ICD-10-CM | POA: Diagnosis present

## 2021-02-05 DIAGNOSIS — D638 Anemia in other chronic diseases classified elsewhere: Secondary | ICD-10-CM | POA: Diagnosis present

## 2021-02-05 DIAGNOSIS — Z21 Asymptomatic human immunodeficiency virus [HIV] infection status: Secondary | ICD-10-CM | POA: Diagnosis present

## 2021-02-05 DIAGNOSIS — C787 Secondary malignant neoplasm of liver and intrahepatic bile duct: Secondary | ICD-10-CM | POA: Diagnosis present

## 2021-02-05 DIAGNOSIS — E860 Dehydration: Secondary | ICD-10-CM | POA: Diagnosis present

## 2021-02-05 DIAGNOSIS — G893 Neoplasm related pain (acute) (chronic): Principal | ICD-10-CM | POA: Diagnosis present

## 2021-02-05 DIAGNOSIS — Z20822 Contact with and (suspected) exposure to covid-19: Secondary | ICD-10-CM | POA: Diagnosis present

## 2021-02-05 DIAGNOSIS — C50911 Malignant neoplasm of unspecified site of right female breast: Secondary | ICD-10-CM | POA: Diagnosis present

## 2021-02-05 DIAGNOSIS — Z79899 Other long term (current) drug therapy: Secondary | ICD-10-CM

## 2021-02-05 DIAGNOSIS — R1310 Dysphagia, unspecified: Secondary | ICD-10-CM | POA: Diagnosis present

## 2021-02-05 DIAGNOSIS — D630 Anemia in neoplastic disease: Secondary | ICD-10-CM | POA: Diagnosis present

## 2021-02-05 DIAGNOSIS — C799 Secondary malignant neoplasm of unspecified site: Secondary | ICD-10-CM | POA: Diagnosis present

## 2021-02-05 DIAGNOSIS — Z538 Procedure and treatment not carried out for other reasons: Secondary | ICD-10-CM | POA: Diagnosis present

## 2021-02-05 DIAGNOSIS — E876 Hypokalemia: Secondary | ICD-10-CM | POA: Diagnosis present

## 2021-02-05 DIAGNOSIS — R64 Cachexia: Secondary | ICD-10-CM | POA: Diagnosis present

## 2021-02-05 DIAGNOSIS — Z66 Do not resuscitate: Secondary | ICD-10-CM | POA: Diagnosis not present

## 2021-02-05 DIAGNOSIS — K5900 Constipation, unspecified: Secondary | ICD-10-CM | POA: Diagnosis present

## 2021-02-05 LAB — CBC WITH DIFFERENTIAL/PLATELET
Abs Immature Granulocytes: 0.06 10*3/uL (ref 0.00–0.07)
Basophils Absolute: 0 10*3/uL (ref 0.0–0.1)
Basophils Relative: 0 %
Eosinophils Absolute: 0 10*3/uL (ref 0.0–0.5)
Eosinophils Relative: 0 %
HCT: 34.5 % — ABNORMAL LOW (ref 36.0–46.0)
Hemoglobin: 11.2 g/dL — ABNORMAL LOW (ref 12.0–15.0)
Immature Granulocytes: 1 %
Lymphocytes Relative: 9 %
Lymphs Abs: 0.7 10*3/uL (ref 0.7–4.0)
MCH: 27.9 pg (ref 26.0–34.0)
MCHC: 32.5 g/dL (ref 30.0–36.0)
MCV: 85.8 fL (ref 80.0–100.0)
Monocytes Absolute: 0.8 10*3/uL (ref 0.1–1.0)
Monocytes Relative: 10 %
Neutro Abs: 6.5 10*3/uL (ref 1.7–7.7)
Neutrophils Relative %: 80 %
Platelets: 505 10*3/uL — ABNORMAL HIGH (ref 150–400)
RBC: 4.02 MIL/uL (ref 3.87–5.11)
RDW: 15.5 % (ref 11.5–15.5)
WBC: 8 10*3/uL (ref 4.0–10.5)
nRBC: 0 % (ref 0.0–0.2)

## 2021-02-05 LAB — COMPREHENSIVE METABOLIC PANEL
ALT: 17 U/L (ref 0–44)
AST: 46 U/L — ABNORMAL HIGH (ref 15–41)
Albumin: 3.4 g/dL — ABNORMAL LOW (ref 3.5–5.0)
Alkaline Phosphatase: 203 U/L — ABNORMAL HIGH (ref 38–126)
Anion gap: 11 (ref 5–15)
BUN: 11 mg/dL (ref 6–20)
CO2: 25 mmol/L (ref 22–32)
Calcium: 10.8 mg/dL — ABNORMAL HIGH (ref 8.9–10.3)
Chloride: 99 mmol/L (ref 98–111)
Creatinine, Ser: 0.58 mg/dL (ref 0.44–1.00)
GFR, Estimated: >60 mL/min (ref >60)
Glucose, Bld: 108 mg/dL — ABNORMAL HIGH (ref 70–99)
Potassium: 3.5 mmol/L (ref 3.5–5.1)
Sodium: 135 mmol/L (ref 135–145)
Total Bilirubin: 0.6 mg/dL (ref 0.3–1.2)
Total Protein: 9.8 g/dL — ABNORMAL HIGH (ref 6.5–8.1)

## 2021-02-05 LAB — URINALYSIS, ROUTINE W REFLEX MICROSCOPIC
Bilirubin Urine: NEGATIVE
Glucose, UA: NEGATIVE mg/dL
Hgb urine dipstick: NEGATIVE
Ketones, ur: 15 mg/dL
Leukocytes,Ua: NEGATIVE
Nitrite: NEGATIVE
Protein, ur: NEGATIVE mg/dL
Specific Gravity, Urine: <=1.005 (ref 1.005–1.030)
pH: 7 (ref 5.0–8.0)

## 2021-02-05 LAB — RESP PANEL BY RT-PCR (FLU A&B, COVID) ARPGX2
Influenza A by PCR: NEGATIVE
Influenza B by PCR: NEGATIVE
SARS Coronavirus 2 by RT PCR: NEGATIVE

## 2021-02-05 LAB — TROPONIN I (HIGH SENSITIVITY)
Troponin I (High Sensitivity): 5 ng/L (ref <18)
Troponin I (High Sensitivity): 5 ng/L (ref <18)

## 2021-02-05 LAB — BRAIN NATRIURETIC PEPTIDE: B Natriuretic Peptide: 17.3 pg/mL (ref 0.0–100.0)

## 2021-02-05 LAB — D-DIMER, QUANTITATIVE: D-Dimer, Quant: 9.07 ug/mL-FEU — ABNORMAL HIGH (ref 0.00–0.50)

## 2021-02-05 MED ORDER — FENTANYL CITRATE PF 50 MCG/ML IJ SOSY
100.0000 ug | PREFILLED_SYRINGE | Freq: Once | INTRAMUSCULAR | Status: AC
  Administered 2021-02-05: 100 ug via INTRAVENOUS
  Filled 2021-02-05: qty 2

## 2021-02-05 MED ORDER — MORPHINE SULFATE (PF) 4 MG/ML IV SOLN
4.0000 mg | INTRAVENOUS | Status: DC | PRN
Start: 2021-02-05 — End: 2021-02-12
  Administered 2021-02-10 – 2021-02-11 (×6): 4 mg via INTRAVENOUS
  Filled 2021-02-05 (×7): qty 1

## 2021-02-05 MED ORDER — LORAZEPAM 0.5 MG PO TABS: 0.5000 mg | ORAL_TABLET | Freq: Every evening | ORAL | 2 refills | Status: DC | PRN

## 2021-02-05 MED ORDER — SODIUM CHLORIDE 0.9 % IV SOLN
INTRAVENOUS | Status: DC
  Administered 2021-02-09 – 2021-02-10 (×2): 1000 mL via INTRAVENOUS

## 2021-02-05 MED ORDER — IOHEXOL 350 MG/ML SOLN
75.0000 mL | Freq: Once | INTRAVENOUS | Status: AC | PRN
  Administered 2021-02-05: 70 mL via INTRAVENOUS

## 2021-02-05 MED ORDER — ENOXAPARIN SODIUM 40 MG/0.4ML IJ SOSY
40.0000 mg | PREFILLED_SYRINGE | INTRAMUSCULAR | Status: DC
  Administered 2021-02-05 – 2021-02-09 (×5): 40 mg via SUBCUTANEOUS
  Filled 2021-02-05 (×5): qty 0.4

## 2021-02-05 MED ORDER — LACTATED RINGERS IV BOLUS
1000.0000 mL | Freq: Once | INTRAVENOUS | Status: AC
  Administered 2021-02-05: 1000 mL via INTRAVENOUS

## 2021-02-05 MED ORDER — DOCUSATE SODIUM 100 MG PO CAPS
100.0000 mg | ORAL_CAPSULE | Freq: Two times a day (BID) | ORAL | Status: DC
  Administered 2021-02-05 – 2021-02-07 (×4): 100 mg via ORAL
  Filled 2021-02-05 (×5): qty 1

## 2021-02-05 MED ORDER — LACTATED RINGERS IV BOLUS
500.0000 mL | Freq: Once | INTRAVENOUS | Status: AC
  Administered 2021-02-05: 500 mL via INTRAVENOUS

## 2021-02-05 MED ORDER — ACETAMINOPHEN 325 MG PO TABS
650.0000 mg | ORAL_TABLET | Freq: Four times a day (QID) | ORAL | Status: DC | PRN
  Administered 2021-02-06: 650 mg via ORAL
  Administered 2021-02-06: 325 mg via ORAL
  Administered 2021-02-08 – 2021-02-14 (×7): 650 mg via ORAL
  Filled 2021-02-05 (×11): qty 2

## 2021-02-05 MED ORDER — OXYCODONE HCL 5 MG PO TABS
10.0000 mg | ORAL_TABLET | Freq: Four times a day (QID) | ORAL | Status: DC | PRN
  Administered 2021-02-06 – 2021-02-07 (×6): 10 mg via ORAL
  Filled 2021-02-05 (×6): qty 2

## 2021-02-05 MED ORDER — ACETAMINOPHEN 650 MG RE SUPP: 650.0000 mg | Freq: Four times a day (QID) | RECTAL | Status: DC | PRN

## 2021-02-05 MED ORDER — BICTEGRAVIR-EMTRICITAB-TENOFOV 50-200-25 MG PO TABS
1.0000 | ORAL_TABLET | Freq: Every day | ORAL | Status: DC
  Administered 2021-02-06 – 2021-02-15 (×10): 1 via ORAL
  Filled 2021-02-05 (×10): qty 1

## 2021-02-05 MED ORDER — LORAZEPAM 0.5 MG PO TABS
0.5000 mg | ORAL_TABLET | Freq: Every evening | ORAL | Status: DC | PRN
  Administered 2021-02-06: 0.5 mg via ORAL
  Filled 2021-02-05: qty 1

## 2021-02-05 MED ORDER — MORPHINE SULFATE (PF) 4 MG/ML IV SOLN
4.0000 mg | Freq: Once | INTRAVENOUS | Status: AC
  Administered 2021-02-05: 4 mg via INTRAVENOUS
  Filled 2021-02-05: qty 1

## 2021-02-05 MED ORDER — POLYETHYLENE GLYCOL 3350 17 G PO PACK
17.0000 g | PACK | Freq: Once | ORAL | Status: AC
  Administered 2021-02-05: 17 g via ORAL
  Filled 2021-02-05: qty 1

## 2021-02-05 MED ORDER — MORPHINE SULFATE (PF) 4 MG/ML IV SOLN: 4.0000 mg | INTRAVENOUS | Status: DC | PRN

## 2021-02-05 MED ORDER — ONDANSETRON HCL 4 MG PO TABS
4.0000 mg | ORAL_TABLET | Freq: Four times a day (QID) | ORAL | Status: DC | PRN
  Filled 2021-02-05: qty 1

## 2021-02-05 MED ORDER — LACTATED RINGERS IV BOLUS: 500.0000 mL | Freq: Once | INTRAVENOUS | Status: DC

## 2021-02-05 MED ORDER — ONDANSETRON HCL 4 MG/2ML IJ SOLN: 4.0000 mg | Freq: Four times a day (QID) | INTRAMUSCULAR | Status: DC | PRN

## 2021-02-05 MED ORDER — FENTANYL CITRATE PF 50 MCG/ML IJ SOSY: 100.0000 ug | PREFILLED_SYRINGE | Freq: Once | INTRAMUSCULAR | Status: DC

## 2021-02-05 MED ORDER — FENTANYL 50 MCG/HR TD PT72
1.0000 | MEDICATED_PATCH | TRANSDERMAL | Status: DC
Start: 2021-02-05 — End: 2021-02-16
  Administered 2021-02-05 – 2021-02-14 (×4): 1 via TRANSDERMAL
  Filled 2021-02-05 (×4): qty 1

## 2021-02-05 MED ORDER — POLYETHYLENE GLYCOL 3350 17 G PO PACK
17.0000 g | PACK | Freq: Two times a day (BID) | ORAL | Status: DC
  Administered 2021-02-05 – 2021-02-15 (×17): 17 g via ORAL
  Filled 2021-02-05 (×17): qty 1

## 2021-02-05 MED ORDER — SODIUM CHLORIDE 0.9 % IV SOLN: INTRAVENOUS | Status: DC

## 2021-02-05 NOTE — H&P (Signed)
History and Physical    [name] [id number] DOB: [birth year] DOA: 02/05/2021  PCP: Pcp, No  Patient coming from: Home.  Today from interventional radiology.  I have personally briefly reviewed patient's old medical records available.   Chief Complaint: Unable to eat.  Pain all over.  HPI: Ashley Ortega is a 50 y.o. female with medical history significant of metastatic breast cancer, incomplete chemotherapies in the past, metastasis to multiple bones/ vertebrae and liver who was here today for liver biopsy, found to have too much pain and discomfort so sent to emergency room.  Patient was recently in the hospital with similar complaints.  She was treated and started on pain medication regimen and discharged home.  Apparently, she had been following up with Dr. Lindi Adie and is on increasing dose of fentanyl however symptoms not controlled. Patient has metastatic breast cancer diagnosed in 2020.  She had received 1 dose of chemotherapy and did not tolerate since then.  Patient also has HIV and currently on Bryan.  Recently found to have diffuse metastatic lesion and was planned for liver biopsy.  Today, she went to interventional radiology.  She felt too weak and in too much pain even to get out of the wheelchair.  Complained of chest discomfort and difficulty breathing.  Really not eating well and feels dehydrated.  Biopsy was canceled and sent to ER. Patient complains of poor appetite chronically but not eating anything for last few days. She also has constipation, taking large dose of laxatives.  Last bowel movement today morning that was a small amount and incomplete.  Has chronic nausea.  She has been trying to drink water but not eating any food as she is scared to eat anything to avoid constipation.  ED Course: Blood pressure is stable.  She is tachycardic.  In obvious distress with pain and discomfort.  Electrolytes normal.  Given multiple doses of morphine and IV fluids in the ER and  admission requested because of significant symptoms. CT scan of the chest negative for pulmonary embolism or pneumonia.  It shows multiple vertebral metastatic lesions. Severe pain and debility in the ER.  Admission requested.  Review of Systems: all systems are reviewed and pertinent positive as per HPI otherwise rest are negative.    History reviewed. No pertinent past medical history. HIV Breast cancer  History reviewed. No pertinent surgical history. Breast cancer biopsy  Social history   has no history on file for tobacco use, alcohol use, and drug use.  No Known Allergies  History reviewed. No pertinent family history. No family history of breast cancer.  Hypertension or diabetes.   Prior to Admission medications   Medication Sig Start Date End Date Taking? Authorizing Provider  bictegravir-emtricitabine-tenofovir AF (BIKTARVY) 50-200-25 MG TABS tablet Take 1 tablet by mouth daily. 10/09/20  Yes [provider]  bisacodyl (DULCOLAX) 10 MG suppository Place 1 suppository (10 mg total) rectally daily as needed for moderate constipation. 01/21/21  Yes [place] Eric J, DO  fentaNYL (DURAGESIC) 25 MCG/HR Place 1 patch onto the skin every 3 (three) days. 01/23/21  Yes Nicholas Lose, MD  Oxycodone HCl 10 MG TABS Take 1 tablet (10 mg total) by mouth every 6 (six) hours as needed. 01/29/21  Yes Nicholas Lose, MD  polyethylene glycol (MIRALAX / GLYCOLAX) 17 g packet Take 17 g by mouth 2 (two) times daily. 01/21/21 04/21/21 Yes [place] Eric J, DO  LORazepam (ATIVAN) 0.5 MG tablet Take 1 tablet (0.5 mg total) by mouth at bedtime as  needed for anxiety. 02/05/21 03/07/21  Nicholas Lose, MD    Physical Exam: Vitals:   02/05/21 1424 02/05/21 1430 02/05/21 1500 02/05/21 1530  BP: (!) 145/86 131/88 132/90 (!) 153/102  Pulse: (!) 110 (!) 107 (!) 106 (!) 121  Resp: '17 16 18 '$ (!) 22  Temp:      TempSrc:      SpO2: 99% 99% 99% 98%    Constitutional: Chronically sick looking.  Frail and  debilitated.  Not in any distress but anxious.  Just received morphine. Vitals:   02/05/21 1424 02/05/21 1430 02/05/21 1500 02/05/21 1530  BP: (!) 145/86 131/88 132/90 (!) 153/102  Pulse: (!) 110 (!) 107 (!) 106 (!) 121  Resp: '17 16 18 '$ (!) 22  Temp:      TempSrc:      SpO2: 99% 99% 99% 98%   Eyes: PERRL, lids and conjunctivae normal ENMT: Mucous membranes are moist. Posterior pharynx clear of any exudate or lesions.Normal dentition.  Neck: normal, supple, no masses, no thyromegaly Respiratory: clear to auscultation bilaterally, no wheezing, no crackles. Normal respiratory effort. No accessory muscle use.  Cardiovascular: Regular rate and rhythm, no murmurs / rubs / gallops. No extremity edema. 2+ pedal pulses. No carotid bruits.  Abdomen: no tenderness, no masses palpated. No hepatosplenomegaly. Bowel sounds positive.  Musculoskeletal: no clubbing / cyanosis. No joint deformity upper and lower extremities. Good ROM, no contractures. Normal muscle tone.  Skin: no rashes, lesions, ulcers. No induration Neurologic: CN 2-12 grossly intact. Sensation intact, DTR normal. Strength 5/5 in all 4.  Psychiatric: Normal judgment and insight. Alert and oriented x 3.  Anxious mood.  Flat affect.    Labs on Admission: I have personally reviewed following labs and imaging studies  CBC: Recent Labs  Lab 02/05/21 1214  WBC 8.0  NEUTROABS 6.5  HGB 11.2*  HCT 34.5*  MCV 85.8  PLT [id number]*   Basic Metabolic Panel: Recent Labs  Lab 02/05/21 1214  NA 135  K 3.5  CL 99  CO2 25  GLUCOSE 108*  BUN 11  CREATININE 0.58  CALCIUM 10.8*   GFR: Estimated Creatinine Clearance: 67.8 mL/min (by C-G formula based on SCr of 0.58 mg/dL). Liver Function Tests: Recent Labs  Lab 02/05/21 1214  AST 46*  ALT 17  ALKPHOS 203*  BILITOT 0.6  PROT 9.8*  ALBUMIN 3.4*   No results for input(s): LIPASE, AMYLASE in the last 168 hours. No results for input(s): AMMONIA in the last 168 hours. Coagulation  Profile: No results for input(s): INR, PROTIME in the last 168 hours. Cardiac Enzymes: No results for input(s): CKTOTAL, CKMB, CKMBINDEX, TROPONINI in the last 168 hours. BNP (last 3 results) No results for input(s): PROBNP in the last 8760 hours. HbA1C: No results for input(s): HGBA1C in the last 72 hours. CBG: No results for input(s): GLUCAP in the last 168 hours. Lipid Profile: No results for input(s): CHOL, HDL, LDLCALC, TRIG, CHOLHDL, LDLDIRECT in the last 72 hours. Thyroid Function Tests: No results for input(s): TSH, T4TOTAL, FREET4, T3FREE, THYROIDAB in the last 72 hours. Anemia Panel: No results for input(s): VITAMINB12, FOLATE, FERRITIN, TIBC, IRON, RETICCTPCT in the last 72 hours. Urine analysis:    Component Value Date/Time   COLORURINE YELLOW 01/18/2021 [place] 01/18/2021 0648   LABSPEC 1.015 01/18/2021 0648   PHURINE 6.0 01/18/2021 [place] 01/18/2021 0648   HGBUR NEGATIVE 01/18/2021 [place] NEGATIVE 01/18/2021 [place] 01/18/2021 [id number]  PROTEINUR NEGATIVE 01/18/2021 0648   NITRITE NEGATIVE 01/18/2021 [place] 01/18/2021 0648    Radiological Exams on Admission: DG Chest 2 View  Result Date: 02/05/2021 CLINICAL DATA:  Shortness of breath EXAM: CHEST - 2 VIEW COMPARISON:  01/14/2021 FINDINGS: No new consolidation or edema. No pleural effusion or pneumothorax. Stable cardiomediastinal contours with normal heart size. No acute osseous abnormality. IMPRESSION: No acute process in the chest. Electronically Signed   By: Macy Mis M.D.   On: 02/05/2021 12:29   CT ANGIO CHEST PE W OR WO CONTRAST  Result Date: 02/05/2021 CLINICAL DATA:  50 year old female with history of shortness of breath since this morning. History of metastatic breast cancer. Evaluate for pulmonary embolism. EXAM: CT ANGIOGRAPHY CHEST WITH CONTRAST TECHNIQUE: Multidetector CT imaging of the chest was performed using the  standard protocol during bolus administration of intravenous contrast. Multiplanar CT image reconstructions and MIPs were obtained to evaluate the vascular anatomy. CONTRAST:  24m OMNIPAQUE IOHEXOL 350 MG/ML SOLN COMPARISON:  Chest CTA 01/14/2021. FINDINGS: Cardiovascular: No filling defects in the pulmonary arterial tree to suggest pulmonary embolism. Heart size is normal. There is no significant pericardial fluid, thickening or pericardial calcification. No atherosclerotic calcifications in the thoracic aorta or the coronary arteries. Mediastinum/Nodes: No pathologically enlarged mediastinal, hilar or internal mammary lymph nodes. Esophagus is unremarkable in appearance. No axillary lymphadenopathy. Lungs/Pleura: New area of subsegmental atelectasis in the left lower lobe. No acute consolidative airspace disease. No pleural effusions. No suspicious appearing pulmonary nodules or masses are noted. Upper Abdomen: Unremarkable. Musculoskeletal: Widespread lytic lesions are again noted throughout the visualized axial and appendicular skeleton, indicative of widespread metastatic disease to the bones. These appear relatively similar to the prior study, although there is increasing pathologic compression related to the lesion in the L1 vertebral body where there is up to 70% loss of height in the lateral aspect of the vertebral body on the right side where there is a large infiltrative mass extending into the transverse process and narrowing the spinal canal, measuring up to 6.4 x 3.8 cm (axial image 119 of series 5). There is also been some further compression at T12, now with up to 50% loss of anterior vertebral body height. Review of the MIP images confirms the above findings. IMPRESSION: 1. No evidence of pulmonary embolism. 2. Widespread metastatic disease to the bones, with increasing pathologic compression fractures at T12 and L1, as above. 3. Small amount of subsegmental atelectasis in the left lower lobe is  new compared to the prior study. Electronically Signed   By: DVinnie LangtonM.D.   On: 02/05/2021 15:00    EKG: Not indicated today.  Assessment/Plan Principal Problem:   Cancer related pain Active Problems:   Metastatic malignant neoplasm (HCC)   Metastatic cancer (HCC)   Constipation   HIV (human immunodeficiency virus infection) ([place]     1.  Stage IV metastatic breast cancer with pain due to cancer/failure to thrive/severe protein calorie malnutrition and cachexia: Agree with monitoring in the hospital given severity of symptoms. IV fluids.  Regular diet, encourage nutrition. Fentanyl patch 50 mcg/h to be applied.  Morphine 4 mg every 2 hours as needed for pain.  Start her home dose of oxycodone 10 mg every 6 hours(she had run out of this at home) Aggressive laxative regimen with Colace and MiraLAX scheduled. Secure chat sent to her oncology for follow-up. Palliative care consultation for symptom management, goals of care discussion.  2.  HIV disease: On Biktarvy.  Taking uninterrupted.  Because of her cancer treatment, she has not been able to follow-up with her ID clinic at [facility], however she has been taking Biktarvy without interruption.  3.  Constipation: As above.  Aggressive bowel regimen.   DVT prophylaxis: Lovenox subcu Code Status: Full code. Family Communication: None. Disposition Plan: Home. Consults called: Oncology, notified.  Palliative consult placed Admission status: Observation.  MedSurg bed.   Barb Merino MD [facility] Pager [phone]

## 2021-02-05 NOTE — Progress Notes (Signed)
Ascencion Dike PA notified of pt. Unable to move from w/c, sob, lots of pain, and not eating in days. Pt. Stated " I'm dehydrated".

## 2021-02-05 NOTE — ED Notes (Signed)
Pt was provided peri-care and new brief applied.

## 2021-02-05 NOTE — ED Provider Notes (Signed)
[name]   CSN: [id number] Arrival date & time: 02/05/21  1132     History Chief Complaint  Patient presents with   Shortness of Breath    Ashley Ortega is a 50 y.o. female.   Shortness of Breath Associated symptoms: chest pain   Associated symptoms: no abdominal pain, no cough, no ear pain, no fever, no headaches, no neck pain, no rash, no sore throat, no vomiting and no wheezing   Patient presents for shortness of breath and tachycardia.  She has a history of HIV (well controlled for the past 20 years), in addition to current stage IV metastatic breast cancer.  She has undergone chemotherapy in the past but has discontinued due to severe side effects.  Currently, she is not on any chemotherapy.  She lives at home with her sister and her adult child.  She states that she has not been able to eat in the past 5 days.  She has been able to drink small amounts of water.  She has ongoing pain from the bony metastases in her back and rib cage.  She is also developed pain in her hips and LLE.  Pain is treated at home with fentanyl patches and oxycodone as needed.  She states that she removed her fentanyl patch this morning and that she has been out of her oxycodone.  She currently endorses significant ongoing chest wall and back pain in addition to shortness of breath.  Prior to arrival, she was scheduled to undergo a biopsy.  Due to her severe symptoms and concerning vital signs, biopsy was canceled and patient was advised to report to the ED.    History reviewed. No pertinent past medical history.  Patient Active Problem List   Diagnosis Date Noted   Metastatic cancer ([place]) 02/05/2021   Cancer related pain 02/05/2021   Constipation 02/05/2021   HIV (human immunodeficiency virus infection) ([place]) 02/05/2021   Metastatic breast cancer ([place]) 01/23/2021   Metastatic malignant neoplasm ([place]) 01/21/2021   Palliative care patient 01/21/2021    Malnutrition of moderate degree 01/16/2021    History reviewed. No pertinent surgical history.   OB History   No obstetric history on file.     History reviewed. No pertinent family history.     Home Medications Prior to Admission medications   Medication Sig Start Date End Date Taking? Authorizing Provider  bictegravir-emtricitabine-tenofovir AF (BIKTARVY) 50-200-25 MG TABS tablet Take 1 tablet by mouth daily. 10/09/20  Yes [provider]  bisacodyl (DULCOLAX) 10 MG suppository Place 1 suppository (10 mg total) rectally daily as needed for moderate constipation. 01/21/21  Yes [place] Eric J, DO  fentaNYL (DURAGESIC) 25 MCG/HR Place 1 patch onto the skin every 3 (three) days. 01/23/21  Yes Nicholas Lose, MD  Oxycodone HCl 10 MG TABS Take 1 tablet (10 mg total) by mouth every 6 (six) hours as needed. 01/29/21  Yes Nicholas Lose, MD  polyethylene glycol (MIRALAX / GLYCOLAX) 17 g packet Take 17 g by mouth 2 (two) times daily. 01/21/21 04/21/21 Yes [place] Eric J, DO  LORazepam (ATIVAN) 0.5 MG tablet Take 1 tablet (0.5 mg total) by mouth at bedtime as needed for anxiety. 02/05/21 03/07/21  Nicholas Lose, MD    Allergies    Patient has no known allergies.  Review of Systems   Review of Systems  Constitutional:  Positive for appetite change and fatigue. Negative for chills and fever.  HENT:  Negative for ear pain and sore throat.  Eyes:  Negative for pain and visual disturbance.  Respiratory:  Positive for chest tightness and shortness of breath. Negative for cough and wheezing.   Cardiovascular:  Positive for chest pain. Negative for palpitations and leg swelling.  Gastrointestinal:  Negative for abdominal pain and vomiting.  Genitourinary:  Positive for decreased urine volume. Negative for dysuria, flank pain and hematuria.  Musculoskeletal:  Positive for arthralgias and back pain. Negative for joint swelling and neck pain.  Skin:  Negative for color change, rash and wound.   Neurological:  Negative for dizziness, seizures, syncope, weakness, light-headedness, numbness and headaches.  All other systems reviewed and are negative.  Physical Exam Updated Vital Signs BP (!) 144/94 (BP Location: Left Arm)   Pulse (!) 108   Temp 98.4 F (36.9 C) (Oral)   Resp 16   SpO2 99%   Physical Exam Vitals and nursing note reviewed.  Constitutional:      General: She is not in acute distress.    Appearance: She is underweight. She is ill-appearing. She is not toxic-appearing or diaphoretic.  HENT:     Head: Normocephalic and atraumatic.  Eyes:     Conjunctiva/sclera: Conjunctivae normal.  Cardiovascular:     Rate and Rhythm: Regular rhythm. Tachycardia present.     Heart sounds: No murmur heard. Pulmonary:     Effort: Pulmonary effort is normal. Tachypnea present. No respiratory distress.     Breath sounds: Normal breath sounds. No decreased breath sounds, wheezing, rhonchi or rales.  Chest:     Chest wall: Tenderness present.  Abdominal:     Palpations: Abdomen is soft.     Tenderness: There is no abdominal tenderness.  Musculoskeletal:     Cervical back: Neck supple.  Skin:    General: Skin is warm and dry.  Neurological:     General: No focal deficit present.     Mental Status: She is alert and oriented to person, place, and time.  Psychiatric:        Mood and Affect: Mood normal.        Behavior: Behavior normal.    ED Results / Procedures / Treatments   Labs (all labs ordered are listed, but only abnormal results are displayed) Labs Reviewed  COMPREHENSIVE METABOLIC PANEL - Abnormal; Notable for the following components:      Result Value   Glucose, Bld 108 (*)    Calcium 10.8 (*)    Total Protein 9.8 (*)    Albumin 3.4 (*)    AST 46 (*)    Alkaline Phosphatase 203 (*)    All other components within normal limits  CBC WITH DIFFERENTIAL/PLATELET - Abnormal; Notable for the following components:   Hemoglobin 11.2 (*)    HCT 34.5 (*)     Platelets 505 (*)    All other components within normal limits  D-DIMER, QUANTITATIVE - Abnormal; Notable for the following components:   D-Dimer, Quant 9.07 (*)    All other components within normal limits  RESP PANEL BY RT-PCR (FLU A&B, COVID) ARPGX2  BRAIN NATRIURETIC PEPTIDE  URINALYSIS, ROUTINE W REFLEX MICROSCOPIC  CALCIUM, IONIZED  BASIC METABOLIC PANEL  CBC  MAGNESIUM  PHOSPHORUS  I-STAT CHEM 8, ED  TROPONIN I (HIGH SENSITIVITY)  TROPONIN I (HIGH SENSITIVITY)    EKG None  Radiology DG Chest 2 View  Result Date: 02/05/2021 CLINICAL DATA:  Shortness of breath EXAM: CHEST - 2 VIEW COMPARISON:  01/14/2021 FINDINGS: No new consolidation or edema. No pleural effusion or pneumothorax. Stable cardiomediastinal  contours with normal heart size. No acute osseous abnormality. IMPRESSION: No acute process in the chest. Electronically Signed   By: Macy Mis M.D.   On: 02/05/2021 12:29   CT ANGIO CHEST PE W OR WO CONTRAST  Result Date: 02/05/2021 CLINICAL DATA:  50 year old female with history of shortness of breath since this morning. History of metastatic breast cancer. Evaluate for pulmonary embolism. EXAM: CT ANGIOGRAPHY CHEST WITH CONTRAST TECHNIQUE: Multidetector CT imaging of the chest was performed using the standard protocol during bolus administration of intravenous contrast. Multiplanar CT image reconstructions and MIPs were obtained to evaluate the vascular anatomy. CONTRAST:  4m OMNIPAQUE IOHEXOL 350 MG/ML SOLN COMPARISON:  Chest CTA 01/14/2021. FINDINGS: Cardiovascular: No filling defects in the pulmonary arterial tree to suggest pulmonary embolism. Heart size is normal. There is no significant pericardial fluid, thickening or pericardial calcification. No atherosclerotic calcifications in the thoracic aorta or the coronary arteries. Mediastinum/Nodes: No pathologically enlarged mediastinal, hilar or internal mammary lymph nodes. Esophagus is unremarkable in appearance. No  axillary lymphadenopathy. Lungs/Pleura: New area of subsegmental atelectasis in the left lower lobe. No acute consolidative airspace disease. No pleural effusions. No suspicious appearing pulmonary nodules or masses are noted. Upper Abdomen: Unremarkable. Musculoskeletal: Widespread lytic lesions are again noted throughout the visualized axial and appendicular skeleton, indicative of widespread metastatic disease to the bones. These appear relatively similar to the prior study, although there is increasing pathologic compression related to the lesion in the L1 vertebral body where there is up to 70% loss of height in the lateral aspect of the vertebral body on the right side where there is a large infiltrative mass extending into the transverse process and narrowing the spinal canal, measuring up to 6.4 x 3.8 cm (axial image 119 of series 5). There is also been some further compression at T12, now with up to 50% loss of anterior vertebral body height. Review of the MIP images confirms the above findings. IMPRESSION: 1. No evidence of pulmonary embolism. 2. Widespread metastatic disease to the bones, with increasing pathologic compression fractures at T12 and L1, as above. 3. Small amount of subsegmental atelectasis in the left lower lobe is new compared to the prior study. Electronically Signed   By: DVinnie LangtonM.D.   On: 02/05/2021 15:00    Procedures Procedures   Medications Ordered in ED Medications  morphine 4 MG/ML injection 4 mg (has no administration in time range)  fentaNYL (DURAGESIC) 50 MCG/HR 1 patch (has no administration in time range)  oxyCODONE (Oxy IR/ROXICODONE) immediate release tablet 10 mg (has no administration in time range)  bictegravir-emtricitabine-tenofovir AF (BIKTARVY) 50-200-25 MG per tablet 1 tablet (has no administration in time range)  LORazepam (ATIVAN) tablet 0.5 mg (has no administration in time range)  polyethylene glycol (MIRALAX / GLYCOLAX) packet 17 g (has no  administration in time range)  enoxaparin (LOVENOX) injection 40 mg (has no administration in time range)  0.9 %  sodium chloride infusion (has no administration in time range)  acetaminophen (TYLENOL) tablet 650 mg (has no administration in time range)    Or  acetaminophen (TYLENOL) suppository 650 mg (has no administration in time range)  docusate sodium (COLACE) capsule 100 mg (has no administration in time range)  ondansetron (ZOFRAN) tablet 4 mg (has no administration in time range)    Or  ondansetron (ZOFRAN) injection 4 mg (has no administration in time range)  lactated ringers bolus 1,000 mL (0 mLs Intravenous Stopped 02/05/21 1546)  iohexol (OMNIPAQUE) 350 MG/ML injection 75  mL (70 mLs Intravenous Contrast Given 02/05/21 1404)  fentaNYL (SUBLIMAZE) injection 100 mcg (100 mcg Intravenous Given 02/05/21 1357)  lactated ringers bolus 500 mL (500 mLs Intravenous New Bag/Given 02/05/21 1555)  lactated ringers bolus 500 mL (500 mLs Intravenous New Bag/Given 02/05/21 1556)  polyethylene glycol (MIRALAX / GLYCOLAX) packet 17 g (17 g Oral Given 02/05/21 1557)  morphine 4 MG/ML injection 4 mg (4 mg Intravenous Given 02/05/21 1557)    ED Course  I have reviewed the triage vital signs and the nursing notes.  Pertinent labs & imaging results that were available during my care of the patient were reviewed by me and considered in my medical decision making (see chart for details).    MDM Rules/Calculators/A&P                           Patient is a 50 year old female with stage IV metastatic breast cancer presenting for acute on chronic pain in chest wall and back in addition to shortness of breath and concern of dehydration.  On arrival, patient significantly tachycardic in the range of 140.  EKG shows sinus rhythm.  Blood pressure remains normal.  Patient has only mildly increased work of breathing.  Lungs are clear to auscultation.  Given her active cancer and concerning vital signs, there is concern of  PE.  Laboratory work-up was initiated.  CTA of chest was ordered.  Bolus of IVF was ordered due to her decreased p.o. intake over the past 5 days.  Fentanyl was ordered for analgesia.  CTA of chest did not show evidence of PE.  It did show worsening bony metastatic disease, including pathologic vertebral compression fractures.  On reassessment, patient reports only mild improvement in her pain.  Morphine was ordered.  Additional bolus of IVF was ordered.  Currently, patient has uncontrolled pain at home.  Additionally, she has not been able to tolerate any p.o. intake other than sips of water.  Presentation is consistent with dehydration.  I spoke with the patient regarding her goals for today.  Patient does feel that she would benefit from inpatient hospital admission.  She states that her caregivers at home would agree with this.  Patient is worried about constipation secondary to opiate analgesia.  MiraLAX was ordered.  Clear liquid diet was ordered with plans to advance as tolerated.  As needed morphine was ordered for continued pain control.  Patient was admitted to hospitalist for ongoing care. Final Clinical Impression(s) / ED Diagnoses Final diagnoses:  Dehydration    Rx / DC Orders ED Discharge Orders     None        Godfrey Pick, MD 02/05/21 [phone]

## 2021-02-05 NOTE — ED Notes (Signed)
Patient transported to CT 

## 2021-02-05 NOTE — Progress Notes (Signed)
Pt presented to short stay for scheduled biopsy today. Has hx of breast cancer with numerous skeletal and liver mets. Currently on Fentanyl patch and Oxycodone for pain control. States she feels too weak and in too much pain to even get out of the wheelchair. Also c/o chest discomfort and difficulty breathing. Reports poor po intake past week or so. BP (!) 131/96   Pulse (!) 135   Temp 98.5 F (36.9 C) (Oral)   Resp (!) 30   SpO2 99%   Pt not medically stable for procedure, expresses wishes to be evaluated in ER for dehydration and pain control. Will defer biopsy for now. Pt transported to ER.  Ascencion Dike PA-C Interventional Radiology 02/05/2021 11:36 AM

## 2021-02-05 NOTE — ED Notes (Signed)
Pt provided meal tray

## 2021-02-05 NOTE — ED Notes (Signed)
Pt requests to contact family regarding admission status.

## 2021-02-05 NOTE — ED Triage Notes (Signed)
Pt complains of shortness of breath since this morning. She believes it is due to running out of her fentanyl patches. Has occasional chest pain.

## 2021-02-05 NOTE — Progress Notes (Signed)
Ascencion Dike PA in room talking with pt., order received to cancel procedure today and to take pt. To the ER for evaluation. Pt. Taken to ER via w/c, report called to charge nurse.

## 2021-02-05 NOTE — ED Notes (Signed)
Transport called.

## 2021-02-06 DIAGNOSIS — K5903 Drug induced constipation: Secondary | ICD-10-CM | POA: Diagnosis not present

## 2021-02-06 DIAGNOSIS — D630 Anemia in neoplastic disease: Secondary | ICD-10-CM | POA: Diagnosis present

## 2021-02-06 DIAGNOSIS — Z9221 Personal history of antineoplastic chemotherapy: Secondary | ICD-10-CM | POA: Diagnosis not present

## 2021-02-06 DIAGNOSIS — C787 Secondary malignant neoplasm of liver and intrahepatic bile duct: Secondary | ICD-10-CM | POA: Diagnosis present

## 2021-02-06 DIAGNOSIS — E43 Unspecified severe protein-calorie malnutrition: Secondary | ICD-10-CM | POA: Diagnosis present

## 2021-02-06 DIAGNOSIS — G893 Neoplasm related pain (acute) (chronic): Secondary | ICD-10-CM | POA: Diagnosis present

## 2021-02-06 DIAGNOSIS — E876 Hypokalemia: Secondary | ICD-10-CM | POA: Diagnosis present

## 2021-02-06 DIAGNOSIS — K5900 Constipation, unspecified: Secondary | ICD-10-CM | POA: Diagnosis present

## 2021-02-06 DIAGNOSIS — R509 Fever, unspecified: Secondary | ICD-10-CM | POA: Diagnosis not present

## 2021-02-06 DIAGNOSIS — Z515 Encounter for palliative care: Secondary | ICD-10-CM | POA: Diagnosis not present

## 2021-02-06 DIAGNOSIS — F419 Anxiety disorder, unspecified: Secondary | ICD-10-CM | POA: Diagnosis present

## 2021-02-06 DIAGNOSIS — C7951 Secondary malignant neoplasm of bone: Secondary | ICD-10-CM | POA: Diagnosis present

## 2021-02-06 DIAGNOSIS — R627 Adult failure to thrive: Secondary | ICD-10-CM | POA: Diagnosis present

## 2021-02-06 DIAGNOSIS — Z171 Estrogen receptor negative status [ER-]: Secondary | ICD-10-CM | POA: Diagnosis not present

## 2021-02-06 DIAGNOSIS — R630 Anorexia: Secondary | ICD-10-CM | POA: Diagnosis present

## 2021-02-06 DIAGNOSIS — Z7189 Other specified counseling: Secondary | ICD-10-CM | POA: Diagnosis not present

## 2021-02-06 DIAGNOSIS — Z681 Body mass index (BMI) 19 or less, adult: Secondary | ICD-10-CM | POA: Diagnosis not present

## 2021-02-06 DIAGNOSIS — R531 Weakness: Secondary | ICD-10-CM | POA: Diagnosis not present

## 2021-02-06 DIAGNOSIS — Z66 Do not resuscitate: Secondary | ICD-10-CM | POA: Diagnosis not present

## 2021-02-06 DIAGNOSIS — D638 Anemia in other chronic diseases classified elsewhere: Secondary | ICD-10-CM | POA: Diagnosis present

## 2021-02-06 DIAGNOSIS — J189 Pneumonia, unspecified organism: Secondary | ICD-10-CM | POA: Diagnosis present

## 2021-02-06 DIAGNOSIS — R52 Pain, unspecified: Secondary | ICD-10-CM | POA: Diagnosis not present

## 2021-02-06 DIAGNOSIS — Z20822 Contact with and (suspected) exposure to covid-19: Secondary | ICD-10-CM | POA: Diagnosis present

## 2021-02-06 DIAGNOSIS — C50911 Malignant neoplasm of unspecified site of right female breast: Secondary | ICD-10-CM | POA: Diagnosis present

## 2021-02-06 DIAGNOSIS — B20 Human immunodeficiency virus [HIV] disease: Secondary | ICD-10-CM | POA: Diagnosis present

## 2021-02-06 DIAGNOSIS — E860 Dehydration: Secondary | ICD-10-CM

## 2021-02-06 DIAGNOSIS — R1310 Dysphagia, unspecified: Secondary | ICD-10-CM | POA: Diagnosis present

## 2021-02-06 DIAGNOSIS — R64 Cachexia: Secondary | ICD-10-CM | POA: Diagnosis present

## 2021-02-06 LAB — CBC
HCT: 24.9 % — ABNORMAL LOW (ref 36.0–46.0)
Hemoglobin: 8.4 g/dL — ABNORMAL LOW (ref 12.0–15.0)
MCH: 28.4 pg (ref 26.0–34.0)
MCHC: 33.7 g/dL (ref 30.0–36.0)
MCV: 84.1 fL (ref 80.0–100.0)
Platelets: 375 10*3/uL (ref 150–400)
RBC: 2.96 MIL/uL — ABNORMAL LOW (ref 3.87–5.11)
RDW: 15.4 % (ref 11.5–15.5)
WBC: 5.2 10*3/uL (ref 4.0–10.5)
nRBC: 0 % (ref 0.0–0.2)

## 2021-02-06 LAB — BASIC METABOLIC PANEL
Anion gap: 6 (ref 5–15)
BUN: 7 mg/dL (ref 6–20)
CO2: 25 mmol/L (ref 22–32)
Calcium: 9.7 mg/dL (ref 8.9–10.3)
Chloride: 105 mmol/L (ref 98–111)
Creatinine, Ser: 0.51 mg/dL (ref 0.44–1.00)
GFR, Estimated: >60 mL/min (ref >60)
Glucose, Bld: 87 mg/dL (ref 70–99)
Potassium: 3.7 mmol/L (ref 3.5–5.1)
Sodium: 136 mmol/L (ref 135–145)

## 2021-02-06 LAB — CALCIUM, IONIZED: Calcium, Ionized, Serum: 5.6 mg/dL (ref 4.5–5.6)

## 2021-02-06 LAB — PHOSPHORUS: Phosphorus: 2.5 mg/dL (ref 2.5–4.6)

## 2021-02-06 LAB — MAGNESIUM: Magnesium: 2 mg/dL (ref 1.7–2.4)

## 2021-02-06 MED ORDER — ALUM & MAG HYDROXIDE-SIMETH 200-200-20 MG/5ML PO SUSP
30.0000 mL | ORAL | Status: DC | PRN
  Administered 2021-02-06: 30 mL via ORAL
  Filled 2021-02-06: qty 30

## 2021-02-06 NOTE — Progress Notes (Signed)
HEMATOLOGY-ONCOLOGY PROGRESS NOTE  SUBJECTIVE: Metastatic breast cancer admitted with pain issues  Oncology History  Metastatic breast cancer ([place])  10/2018 Initial Diagnosis   Locally advanced right breast cancer triple negative, status post 2 cycles of neoadjuvant dose dense Adriamycin and Cytoxan, lost to follow-up (holistic therapy), initially followed by Dr. Cecil Cranker at [facility] cancer care   09/04/2020 Relapse/Recurrence   Bilateral breast cancers with diffuse osseous metastases Left breast: Grade 2 IDC ER 5%, PR less than 1%, HER2 negative, Ki-67 40% Right breast: Grade 2 IDC, ER less than 1%, PR less than 1%, HER2 negative, Ki-67 70% Left axilla lymph node biopsy: Metastatic carcinoma 12/13/2020: MRI thoracic spine: Innumerable bone lesions Followed by Dr. Merrie Roof    09/25/2020 -  Chemotherapy   Palliative chemotherapy with Taxol discontinued after 1 cycle      OBJECTIVE: REVIEW OF SYSTEMS:   Severe pain    PHYSICAL EXAMINATION: ECOG PERFORMANCE STATUS: 3 - Symptomatic, >50% confined to bed  Vitals:   02/06/21 1510 02/06/21 1526  BP: 131/90   Pulse: (!) 112 (!) 112  Resp: 16   Temp: (!) 100.7 F (38.2 C) (!) 100.6 F (38.1 C)  SpO2: 99%    There were no vitals filed for this visit.  GENERAL:alert, no distress and comfortable SKIN: skin color, texture, turgor are normal, no rashes or significant lesions EYES: normal, Conjunctiva are pink and non-injected, sclera clear OROPHARYNX:no exudate, no erythema and lips, buccal mucosa, and tongue normal  NECK: supple, thyroid normal size, non-tender, without nodularity LYMPH:  no palpable lymphadenopathy in the cervical, axillary or inguinal LUNGS: clear to auscultation and percussion with normal breathing effort HEART: regular rate & rhythm and no murmurs and no lower extremity edema ABDOMEN:abdomen soft, non-tender and normal bowel sounds Musculoskeletal:no cyanosis of digits and no clubbing  NEURO: alert & oriented x 3  with fluent speech, no focal motor/sensory deficits  LABORATORY DATA:  I have reviewed the data as listed CMP Latest Ref Rng & Units 02/06/2021 02/05/2021 01/21/2021  Glucose 70 - 99 mg/dL 87 108(H) 101(H)  BUN 6 - 20 mg/dL 7 11 10   Creatinine 0.44 - 1.00 mg/dL 0.51 0.58 0.58  Sodium 135 - 145 mmol/L 136 135 133(L)  Potassium 3.5 - 5.1 mmol/L 3.7 3.5 3.8  Chloride 98 - 111 mmol/L 105 99 99  CO2 22 - 32 mmol/L 25 25 28   Calcium 8.9 - 10.3 mg/dL 9.7 10.8(H) 8.4(L)  Total Protein 6.5 - 8.1 g/dL - 9.8(H) -  Total Bilirubin 0.3 - 1.2 mg/dL - 0.6 -  Alkaline Phos 38 - 126 U/L - 203(H) -  AST 15 - 41 U/L - 46(H) -  ALT 0 - 44 U/L - 17 -    Lab Results  Component Value Date   WBC 5.2 02/06/2021   HGB 8.4 (L) 02/06/2021   HCT 24.9 (L) 02/06/2021   MCV 84.1 02/06/2021   PLT 375 02/06/2021   NEUTROABS 6.5 02/05/2021    ASSESSMENT AND PLAN: 1. Metastatic breast cancer: Bone metastases and liver metastases CT abdomen and pelvis 12/23/2020: Progressive metastatic disease widespread in the skeleton and new lesions in the liver CT chest 01/14/2021: Progression of lytic metastatic lesions in the rib and vertebral, hepatic metastasis  Plan: Please consult palliative care: I discussed with her that she may have less than 6 months life expectancy and that hospice care is also not unreasonable. She doesnot want chemo. So unless the biopsy shows ER/PR positive, there may not be any treatment options.  Liver biopsy was not done

## 2021-02-06 NOTE — Plan of Care (Signed)
  Problem: Education: Goal: Knowledge of General Education information will improve Description: Including pain rating scale, medication(s)/side effects and non-pharmacologic comfort measures 02/06/2021 2008 by Levonne Hubert, RN Outcome: Progressing 02/06/2021 0705 by Levonne Hubert, RN Outcome: Progressing   Problem: Health Behavior/Discharge Planning: Goal: Ability to manage health-related needs will improve 02/06/2021 2008 by Levonne Hubert, RN Outcome: Progressing 02/06/2021 0705 by Levonne Hubert, RN Outcome: Progressing   Problem: Clinical Measurements: Goal: Ability to maintain clinical measurements within normal limits will improve 02/06/2021 2008 by Levonne Hubert, RN Outcome: Progressing 02/06/2021 0705 by Levonne Hubert, RN Outcome: Progressing Goal: Will remain free from infection 02/06/2021 2008 by Levonne Hubert, RN Outcome: Progressing 02/06/2021 0705 by Levonne Hubert, RN Outcome: Progressing Goal: Diagnostic test results will improve 02/06/2021 2008 by Levonne Hubert, RN Outcome: Progressing 02/06/2021 0705 by Levonne Hubert, RN Outcome: Progressing Goal: Respiratory complications will improve 02/06/2021 2008 by Levonne Hubert, RN Outcome: Progressing 02/06/2021 0705 by Levonne Hubert, RN Outcome: Progressing Goal: Cardiovascular complication will be avoided 02/06/2021 2008 by Levonne Hubert, RN Outcome: Progressing 02/06/2021 0705 by Levonne Hubert, RN Outcome: Progressing   Problem: Activity: Goal: Risk for activity intolerance will decrease 02/06/2021 2008 by Levonne Hubert, RN Outcome: Progressing 02/06/2021 0705 by Levonne Hubert, RN Outcome: Progressing   Problem: Nutrition: Goal: Adequate nutrition will be maintained 02/06/2021 2008 by Levonne Hubert, RN Outcome: Progressing 02/06/2021 0705 by Levonne Hubert, RN Outcome: Progressing   Problem: Coping: Goal: Level of anxiety will decrease 02/06/2021 2008 by Levonne Hubert, RN Outcome:  Progressing 02/06/2021 0705 by Levonne Hubert, RN Outcome: Progressing   Problem: Elimination: Goal: Will not experience complications related to bowel motility 02/06/2021 2008 by Levonne Hubert, RN Outcome: Progressing 02/06/2021 0705 by Levonne Hubert, RN Outcome: Progressing Goal: Will not experience complications related to urinary retention 02/06/2021 2008 by Levonne Hubert, RN Outcome: Progressing 02/06/2021 0705 by Levonne Hubert, RN Outcome: Progressing   Problem: Pain Managment: Goal: General experience of comfort will improve 02/06/2021 2008 by Levonne Hubert, RN Outcome: Progressing 02/06/2021 0705 by Levonne Hubert, RN Outcome: Progressing   Problem: Safety: Goal: Ability to remain free from injury will improve 02/06/2021 2008 by Levonne Hubert, RN Outcome: Progressing 02/06/2021 0705 by Levonne Hubert, RN Outcome: Progressing   Problem: Skin Integrity: Goal: Risk for impaired skin integrity will decrease 02/06/2021 2008 by Levonne Hubert, RN Outcome: Progressing 02/06/2021 0705 by Levonne Hubert, RN Outcome: Progressing

## 2021-02-06 NOTE — Progress Notes (Signed)
PROGRESS NOTE    Ashley Ortega  [id number] DOB: 01/24/1971 DOA: 02/05/2021 PCP: Pcp, No (   Chief Complaint  Patient presents with   Shortness of Breath    Brief Narrative:  Ashley Ortega is a 50 y.o. female with medical history significant of metastatic breast cancer, incomplete chemotherapies in the past, metastasis to multiple bones/ vertebrae and liver who was here today for liver biopsy, found to have too much pain and discomfort so sent to emergency room.  Patient was in the ED for similar complaints recently and was discharged home on pain medications.  Patient was diagnosed with metastatic breast cancer in 2020.  She received 1 dose of IV chemotherapy and was not able to tolerate any further chemo's.  She was also recently found to have metastatic lesions in the liver.  And was scheduled for liver biopsy.  Patient was deemed not medically stable for procedure due to persistent pain and she was sent to ED for evaluation of pain control and dehydration.  Assessment & Plan:   Principal Problem:   Cancer related pain Active Problems:   Metastatic malignant neoplasm ([place])   Metastatic cancer (HCC)   Constipation   HIV (human immunodeficiency virus infection) ([place])   Stage IV metastatic breast cancer with mets to the liver and bone coming with severe pain, failure to thrive, severe protein calorie malnutrition. -Aggressive pain control with fentanyl patch 50 MCG, IV morphine every 2 hours as needed and restarted her home dose of oxycodone 10 mg every 6 hours as needed Aggressive laxative regimen added Will await oncology recommendations Palliative care consulted by admitting physician for goals of care discussion and symptom management.    Anemia of chronic disease Baseline hemoglobin around 8. 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  Dispo: The patient is from: Home              Anticipated d/c is to: Home              Patient currently is not medically stable to d/c.   Difficult to place patient No       Consultants:  Palliative care consult.  Oncology  Procedures: None.   Antimicrobials: (none.    Subjective:   Objective: Vitals:   02/05/21 1831 02/05/21 2122 02/06/21 0330 02/06/21 0551  BP: (!) 144/94 (!) 135/92 (!) 140/94 (!) 127/91  Pulse: (!) 108 (!) 106 100 98  Resp: '16 18 16 16  '$ Temp: 98.4 F (36.9 C) 98.9 F (37.2 C) 98.6 F (37 C) 98.1 F (36.7 C)  TempSrc: Oral Oral Oral Oral  SpO2: 99% 99% 99% 99%    Intake/Output Summary (Last 24 hours) at 02/06/2021 0948 Last data filed at 02/06/2021 0435 Gross per 24 hour  Intake 1000 ml  Output 1300 ml  Net -300 ml   There were no vitals filed for this visit.  Examination:  General exam: Appears calm and comfortable  Respiratory system: Clear to auscultation. Respiratory effort normal. Cardiovascular system: S1 & S2 heard, RRR. No JVD,  No pedal edema. Gastrointestinal system: Abdomen is nondistended, soft and nontender. . Normal bowel sounds heard. Central nervous system: Alert and oriented. No focal neurological deficits. Extremities: Symmetric 5 x 5 power. Skin: No  rashes, lesions or ulcers Psychiatry: Mood & affect appropriate.     Data Reviewed: I have personally reviewed following labs and imaging studies  CBC: Recent Labs  Lab 02/05/21 1214 02/06/21 0518  WBC 8.0 5.2  NEUTROABS 6.5  --   HGB 11.2* 8.4*  HCT 34.5* 24.9*  MCV 85.8 84.1  PLT 505* [id number]    Basic Metabolic Panel: Recent Labs  Lab 02/05/21 1214 02/06/21 0518  NA 135 136  K 3.5 3.7  CL 99 105  CO2 25 25  GLUCOSE 108* 87  BUN 11 7  CREATININE 0.58 0.51  CALCIUM 10.8* 9.7  MG  --  2.0  PHOS   --  2.5    GFR: Estimated Creatinine Clearance: 67.8 mL/min (by C-G formula based on SCr of 0.51 mg/dL).  Liver Function Tests: Recent Labs  Lab 02/05/21 1214  AST 46*  ALT 17  ALKPHOS 203*  BILITOT 0.6  PROT 9.8*  ALBUMIN 3.4*    CBG: No results for input(s): GLUCAP in the last 168 hours.   Recent Results (from the past 240 hour(s))  Resp Panel by RT-PCR (Flu A&B, Covid) Nasopharyngeal Swab     Status: None   Collection Time: 02/05/21 12:15 PM   Specimen: Nasopharyngeal Swab; Nasopharyngeal(NP) swabs in vial transport medium  Result Value Ref Range Status   SARS Coronavirus 2 by RT PCR NEGATIVE NEGATIVE Final    Comment: (NOTE) SARS-CoV-2 target nucleic acids are NOT DETECTED.  The SARS-CoV-2 RNA is generally detectable in upper respiratory specimens during the acute phase of infection. The lowest concentration of SARS-CoV-2 viral copies this assay can detect is 138 copies/mL. A negative result does not preclude SARS-Cov-2 infection and should not be used as the sole basis for treatment or other patient management decisions. A negative result may occur with  improper specimen collection/handling, submission of specimen other than nasopharyngeal swab, presence of viral mutation(s) within the areas targeted by this assay, and inadequate number of viral copies(<138 copies/mL). A negative result must be combined with clinical observations, patient history, and epidemiological information. The expected result is Negative.  Fact Sheet for Patients:  EntrepreneurPulse.com.au  Fact Sheet for Healthcare Providers:  IncredibleEmployment.be  This test is no t yet approved or cleared by the [place] FDA and  has been authorized for detection and/or diagnosis of SARS-CoV-2 by FDA under an Emergency Use Authorization (EUA). This EUA will remain  in effect (meaning this test can be used) for the duration of the COVID-19 declaration under  Section 564(b)(1) of the Act, 21 U.S.C.section 360bbb-3(b)(1), unless the authorization is terminated  or revoked sooner.       Influenza A by PCR NEGATIVE NEGATIVE Final   Influenza B by PCR NEGATIVE NEGATIVE Final    Comment: (NOTE) The Xpert Xpress SARS-CoV-2/FLU/RSV plus assay is intended as an aid in the diagnosis of influenza from Nasopharyngeal swab specimens and should not be used as a sole basis for treatment. Nasal washings and aspirates are unacceptable for Xpert Xpress SARS-CoV-2/FLU/RSV testing.  Fact Sheet for Patients: EntrepreneurPulse.com.au  Fact Sheet for Healthcare Providers: IncredibleEmployment.be  This test is not yet approved or cleared by the [place] FDA and has been authorized for detection and/or diagnosis of SARS-CoV-2 by FDA under an Emergency Use Authorization (EUA). This EUA will remain in effect (meaning this test can be used) for the duration of the COVID-19 declaration under Section 564(b)(1) of the Act, 21 U.S.C. section 360bbb-3(b)(1), unless the authorization is terminated or revoked.  Performed  at [facility], [place] [address] [place], [place] [address]          Radiology Studies: DG Chest 2 View  Result Date: 02/05/2021 CLINICAL DATA:  Shortness of breath EXAM: CHEST - 2 VIEW COMPARISON:  01/14/2021 FINDINGS: No new consolidation or edema. No pleural effusion or pneumothorax. Stable cardiomediastinal contours with normal heart size. No acute osseous abnormality. IMPRESSION: No acute process in the chest. Electronically Signed   By: Macy Mis M.D.   On: 02/05/2021 12:29   CT ANGIO CHEST PE W OR WO CONTRAST  Result Date: 02/05/2021 CLINICAL DATA:  50 year old female with history of shortness of breath since this morning. History of metastatic breast cancer. Evaluate for pulmonary embolism. EXAM: CT ANGIOGRAPHY CHEST WITH CONTRAST TECHNIQUE: Multidetector CT imaging of the  chest was performed using the standard protocol during bolus administration of intravenous contrast. Multiplanar CT image reconstructions and MIPs were obtained to evaluate the vascular anatomy. CONTRAST:  55m OMNIPAQUE IOHEXOL 350 MG/ML SOLN COMPARISON:  Chest CTA 01/14/2021. FINDINGS: Cardiovascular: No filling defects in the pulmonary arterial tree to suggest pulmonary embolism. Heart size is normal. There is no significant pericardial fluid, thickening or pericardial calcification. No atherosclerotic calcifications in the thoracic aorta or the coronary arteries. Mediastinum/Nodes: No pathologically enlarged mediastinal, hilar or internal mammary lymph nodes. Esophagus is unremarkable in appearance. No axillary lymphadenopathy. Lungs/Pleura: New area of subsegmental atelectasis in the left lower lobe. No acute consolidative airspace disease. No pleural effusions. No suspicious appearing pulmonary nodules or masses are noted. Upper Abdomen: Unremarkable. Musculoskeletal: Widespread lytic lesions are again noted throughout the visualized axial and appendicular skeleton, indicative of widespread metastatic disease to the bones. These appear relatively similar to the prior study, although there is increasing pathologic compression related to the lesion in the L1 vertebral body where there is up to 70% loss of height in the lateral aspect of the vertebral body on the right side where there is a large infiltrative mass extending into the transverse process and narrowing the spinal canal, measuring up to 6.4 x 3.8 cm (axial image 119 of series 5). There is also been some further compression at T12, now with up to 50% loss of anterior vertebral body height. Review of the MIP images confirms the above findings. IMPRESSION: 1. No evidence of pulmonary embolism. 2. Widespread metastatic disease to the bones, with increasing pathologic compression fractures at T12 and L1, as above. 3. Small amount of subsegmental  atelectasis in the left lower lobe is new compared to the prior study. Electronically Signed   By: DVinnie LangtonM.D.   On: 02/05/2021 15:00        Scheduled Meds:  bictegravir-emtricitabine-tenofovir AF  1 tablet Oral Daily   docusate sodium  100 mg Oral BID   enoxaparin (LOVENOX) injection  40 mg Subcutaneous Q24H   fentaNYL  1 patch Transdermal Q72H   polyethylene glycol  17 g Oral BID   Continuous Infusions:  sodium chloride 75 mL/hr at 02/06/21 0836     LOS: 0 days    Time spent: 3100mutes    ViHosie PoissonMD [facility]   To contact the attending provider between 7A-7P or the covering provider during after hours 7P-7A, please log into the web site [email or web address] and access using universal [place] password for that web site. If you do not have the password, please call the hospital operator.  02/06/2021, 9:48 AM

## 2021-02-06 NOTE — Plan of Care (Signed)

## 2021-02-06 NOTE — Progress Notes (Signed)
   02/06/21 1526  Assess: MEWS Score  Temp (!) 100.6 F (38.1 C)  Pulse Rate (!) 112  Assess: MEWS Score  MEWS Temp 1  MEWS Systolic 0  MEWS Pulse 2  MEWS RR 0  MEWS LOC 0  MEWS Score 3  MEWS Score Color Yellow  Assess: if the MEWS score is Yellow or Red  Were vital signs taken at a resting state? Yes  Focused Assessment Change from prior assessment (see assessment flowsheet)  Does the patient meet 2 or more of the SIRS criteria? No  Does the patient have a confirmed or suspected source of infection? No  Provider and Rapid Response Notified? No  MEWS guidelines implemented *See Row Information* Yes  Take Vital Signs  Increase Vital Sign Frequency  Yellow: Q 2hr X 2 then Q 4hr X 2, if remains yellow, continue Q 4hrs  Escalate  MEWS: Escalate Yellow: discuss with charge nurse/RN and consider discussing with provider and RRT  Notify: Charge Nurse/RN  Name of Charge Nurse/RN Notified Kimyetta Flott/Holli Socin  Date Charge Nurse/RN Notified 02/06/21  Time Charge Nurse/RN Notified 46  Notify: Provider  Provider Name/Title Karleen Hampshire  Date Provider Notified 02/06/21  Time Provider Notified 1528  Notification Type Page  Notification Reason Critical result  Provider response No new orders  Date of Provider Response 02/06/21  Time of Provider Response 1528  Assess: SIRS CRITERIA  SIRS Temperature  0  SIRS Pulse 1  SIRS Respirations  0  SIRS WBC 0  SIRS Score Sum  1

## 2021-02-07 ENCOUNTER — Other Ambulatory Visit: Payer: Medicaid Other

## 2021-02-07 ENCOUNTER — Other Ambulatory Visit: Payer: Medicaid Other | Admitting: *Deleted

## 2021-02-07 DIAGNOSIS — Z515 Encounter for palliative care: Secondary | ICD-10-CM

## 2021-02-07 DIAGNOSIS — B20 Human immunodeficiency virus [HIV] disease: Secondary | ICD-10-CM

## 2021-02-07 DIAGNOSIS — Z7189 Other specified counseling: Secondary | ICD-10-CM

## 2021-02-07 DIAGNOSIS — C787 Secondary malignant neoplasm of liver and intrahepatic bile duct: Secondary | ICD-10-CM

## 2021-02-07 MED ORDER — SENNA 8.6 MG PO TABS
2.0000 | ORAL_TABLET | Freq: Two times a day (BID) | ORAL | Status: DC
  Administered 2021-02-07 – 2021-02-15 (×15): 17.2 mg via ORAL
  Filled 2021-02-07 (×15): qty 2

## 2021-02-07 MED ORDER — ALPRAZOLAM 0.5 MG PO TABS
0.5000 mg | ORAL_TABLET | Freq: Three times a day (TID) | ORAL | Status: DC | PRN
  Administered 2021-02-07 – 2021-02-15 (×20): 0.5 mg via ORAL
  Filled 2021-02-07 (×20): qty 1

## 2021-02-07 MED ORDER — LORAZEPAM 2 MG/ML IJ SOLN
0.2500 mg | Freq: Once | INTRAMUSCULAR | Status: AC
  Administered 2021-02-07: 0.25 mg via INTRAVENOUS
  Filled 2021-02-07: qty 1

## 2021-02-07 MED ORDER — OXYCODONE HCL 5 MG PO TABS
10.0000 mg | ORAL_TABLET | ORAL | Status: DC | PRN
  Administered 2021-02-07 – 2021-02-13 (×27): 10 mg via ORAL
  Filled 2021-02-07 (×28): qty 2

## 2021-02-07 NOTE — Discharge Instructions (Signed)
Please follow-up with oncology as.Marland KitchenMarland Kitchen

## 2021-02-07 NOTE — Progress Notes (Signed)
   02/07/21 0824  Assess: MEWS Score  Temp 99.3 F (37.4 C)  BP (!) 140/94  Pulse Rate (!) 111  Resp 16  SpO2 98 %  Assess: MEWS Score  MEWS Temp 0  MEWS Systolic 0  MEWS Pulse 2  MEWS RR 0  MEWS LOC 0  MEWS Score 2  MEWS Score Color Yellow  Assess: if the MEWS score is Yellow or Red  Were vital signs taken at a resting state? Yes  Focused Assessment No change from prior assessment  Does the patient meet 2 or more of the SIRS criteria? No  Does the patient have a confirmed or suspected source of infection? No  Provider and Rapid Response Notified? Yes  MEWS guidelines implemented *See Row Information* Yes  Treat  MEWS Interventions Administered prn meds/treatments  Take Vital Signs  Increase Vital Sign Frequency  Yellow: Q 2hr X 2 then Q 4hr X 2, if remains yellow, continue Q 4hrs  Escalate  MEWS: Escalate Yellow: discuss with charge nurse/RN and consider discussing with provider and RRT  Notify: Charge Nurse/RN  Name of Charge Nurse/RN Notified Self  Date Charge Nurse/RN Notified 02/07/21  Time Charge Nurse/RN Notified 0830  Notify: Provider  Provider Name/Title Dr Karleen Hampshire  Date Provider Notified 02/07/21  Time Provider Notified [phone]  Notification Type Page  Notification Reason Change in status  Provider response No new orders  Date of Provider Response 02/07/21  Time of Provider Response [phone]  Document  Patient Outcome Other (Comment)  Progress note created (see row info) Yes  Assess: SIRS CRITERIA  SIRS Temperature  0  SIRS Pulse 1  SIRS Respirations  0  SIRS WBC 0  SIRS Score Sum  1

## 2021-02-07 NOTE — Progress Notes (Signed)
PROGRESS NOTE    Ashley Ortega  [id number] DOB: [birth year] DOA: 02/05/2021 PCP: Pcp, No (   Chief Complaint  Patient presents with   Shortness of Breath    Brief Narrative:  Ashley Ortega is a 50 y.o. female with medical history significant of metastatic breast cancer, incomplete chemotherapies in the past, metastasis to multiple bones/ vertebrae and liver who was here today for liver biopsy, found to have too much pain and discomfort so sent to emergency room.  Patient was in the ED for similar complaints recently and was discharged home on pain medications.  Patient was diagnosed with metastatic breast cancer in 2020.  She received 1 dose of IV chemotherapy and was not able to tolerate any further chemo's.  She was also recently found to have metastatic lesions in the liver.  And was scheduled for liver biopsy.  Patient was deemed not medically stable for procedure due to persistent pain and she was sent to ED for evaluation of pain control and dehydration.  Assessment & Plan:   Principal Problem:   Cancer related pain Active Problems:   Metastatic malignant neoplasm ([place])   Metastatic cancer (HCC)   Constipation   HIV (human immunodeficiency virus infection) ([place])   Stage IV metastatic breast cancer with mets to the liver and bone coming with severe pain, failure to thrive, severe protein calorie malnutrition. -Aggressive pain control with fentanyl patch 50 MCG, IV morphine every 2 hours as needed and restarted her home dose of oxycodone 10 mg every 6 hours as needed Aggressive laxative regimen added Palliative care consulted by admitting physician for goals of care discussion and symptom management. Appreciate oncology in put.   Constipation:  Added laxatives.    Anxiety:  Added prn xanax.     Anemia of chronic disease Baseline hemoglobin around 8. Hemoglobin on admission probably hemoconcentrated sample due to dehydration. Recheck labs In am.    HIV disease  on Biktarvy Continue the same.  Protein calorie malnutrition:  Dietary consulted .     Failure to thrive.     DVT prophylaxis: (Lovenox) Code Status: (Full code) Family Communication: ( none at bedside) Disposition:   Status is: Inpatient.   The patient will require care spanning > 2 midnights and should be moved to inpatient because: Unsafe d/c plan and IV treatments appropriate due to intensity of illness or inability to take PO  Dispo: The patient is from: Home              Anticipated d/c is to: Home              Patient currently is not medically stable to d/c.   Difficult to place patient No       Consultants:  Palliative care consult.  Oncology  Procedures: None.   Antimicrobials: none.    Subjective: Pain well controlled.   Objective: Vitals:   02/06/21 2227 02/07/21 0217 02/07/21 0506 02/07/21 0824  BP: 130/83 117/89 (!) 142/98 (!) 140/94  Pulse: (!) 109 (!) 103 (!) 108 (!) 111  Resp: '16 16 16 16  '$ Temp: 99.1 F (37.3 C) 98 F (36.7 C) 98.6 F (37 C) 99.3 F (37.4 C)  TempSrc: Oral Oral Oral Oral  SpO2: 100% 100% 100% 98%    Intake/Output Summary (Last 24 hours) at 02/07/2021 1300 Last data filed at 02/07/2021 1053 Gross per 24 hour  Intake 1020 ml  Output 900 ml  Net 120 ml    There were no vitals filed for this  visit.  Examination:  General exam: ill appearing lady, not in distress.  Respiratory system: air entry fair, no wheezing heard.  Cardiovascular system: S1 , S2 tachycardic, no JVD, no pedal edema.  Gastrointestinal system: Abdomen is soft, non tender non distended bowel sounds wnl.  Central nervous system: Alert and oriented, grossly non focal.  Extremities: no pedal edema Skin: No rashes seen.  Psychiatry: Mood is appropriate.     Data Reviewed: I have personally reviewed following labs and imaging studies  CBC: Recent Labs  Lab 02/05/21 1214 02/06/21 0518  WBC 8.0 5.2  NEUTROABS 6.5  --   HGB 11.2* 8.4*  HCT  34.5* 24.9*  MCV 85.8 84.1  PLT 505* 375     Basic Metabolic Panel: Recent Labs  Lab 02/05/21 1214 02/06/21 0518  NA 135 136  K 3.5 3.7  CL 99 105  CO2 25 25  GLUCOSE 108* 87  BUN 11 7  CREATININE 0.58 0.51  CALCIUM 10.8* 9.7  MG  --  2.0  PHOS  --  2.5     GFR: CrCl cannot be calculated (Unknown ideal weight.).  Liver Function Tests: Recent Labs  Lab 02/05/21 1214  AST 46*  ALT 17  ALKPHOS 203*  BILITOT 0.6  PROT 9.8*  ALBUMIN 3.4*     CBG: No results for input(s): GLUCAP in the last 168 hours.   Recent Results (from the past 240 hour(s))  Resp Panel by RT-PCR (Flu A&B, Covid) Nasopharyngeal Swab     Status: None   Collection Time: 02/05/21 12:15 PM   Specimen: Nasopharyngeal Swab; Nasopharyngeal(NP) swabs in vial transport medium  Result Value Ref Range Status   SARS Coronavirus 2 by RT PCR NEGATIVE NEGATIVE Final    Comment: (NOTE) SARS-CoV-2 target nucleic acids are NOT DETECTED.  The SARS-CoV-2 RNA is generally detectable in upper respiratory specimens during the acute phase of infection. The lowest concentration of SARS-CoV-2 viral copies this assay can detect is 138 copies/mL. A negative result does not preclude SARS-Cov-2 infection and should not be used as the sole basis for treatment or other patient management decisions. A negative result may occur with  improper specimen collection/handling, submission of specimen other than nasopharyngeal swab, presence of viral mutation(s) within the areas targeted by this assay, and inadequate number of viral copies(<138 copies/mL). A negative result must be combined with clinical observations, patient history, and epidemiological information. The expected result is Negative.  Fact Sheet for Patients:  EntrepreneurPulse.com.au  Fact Sheet for Healthcare Providers:  IncredibleEmployment.be  This test is no t yet approved or cleared by the [place] FDA and   has been authorized for detection and/or diagnosis of SARS-CoV-2 by FDA under an Emergency Use Authorization (EUA). This EUA will remain  in effect (meaning this test can be used) for the duration of the COVID-19 declaration under Section 564(b)(1) of the Act, 21 U.S.C.section 360bbb-3(b)(1), unless the authorization is terminated  or revoked sooner.       Influenza A by PCR NEGATIVE NEGATIVE Final   Influenza B by PCR NEGATIVE NEGATIVE Final    Comment: (NOTE) The Xpert Xpress SARS-CoV-2/FLU/RSV plus assay is intended as an aid in the diagnosis of influenza from Nasopharyngeal swab specimens and should not be used as a sole basis for treatment. Nasal washings and aspirates are unacceptable for Xpert Xpress SARS-CoV-2/FLU/RSV testing.  Fact Sheet for Patients: EntrepreneurPulse.com.au  Fact Sheet for Healthcare Providers: IncredibleEmployment.be  This test is not yet approved or cleared by the [place]  FDA and has been authorized for detection and/or diagnosis of SARS-CoV-2 by FDA under an Emergency Use Authorization (EUA). This EUA will remain in effect (meaning this test can be used) for the duration of the COVID-19 declaration under Section 564(b)(1) of the Act, 21 U.S.C. section 360bbb-3(b)(1), unless the authorization is terminated or revoked.  Performed at [facility], [place] [address] [place], [place] [address]           Radiology Studies: CT ANGIO CHEST PE W OR WO CONTRAST  Result Date: 02/05/2021 CLINICAL DATA:  50 year old female with history of shortness of breath since this morning. History of metastatic breast cancer. Evaluate for pulmonary embolism. EXAM: CT ANGIOGRAPHY CHEST WITH CONTRAST TECHNIQUE: Multidetector CT imaging of the chest was performed using the standard protocol during bolus administration of intravenous contrast. Multiplanar CT image reconstructions and MIPs were obtained to  evaluate the vascular anatomy. CONTRAST:  82m OMNIPAQUE IOHEXOL 350 MG/ML SOLN COMPARISON:  Chest CTA 01/14/2021. FINDINGS: Cardiovascular: No filling defects in the pulmonary arterial tree to suggest pulmonary embolism. Heart size is normal. There is no significant pericardial fluid, thickening or pericardial calcification. No atherosclerotic calcifications in the thoracic aorta or the coronary arteries. Mediastinum/Nodes: No pathologically enlarged mediastinal, hilar or internal mammary lymph nodes. Esophagus is unremarkable in appearance. No axillary lymphadenopathy. Lungs/Pleura: New area of subsegmental atelectasis in the left lower lobe. No acute consolidative airspace disease. No pleural effusions. No suspicious appearing pulmonary nodules or masses are noted. Upper Abdomen: Unremarkable. Musculoskeletal: Widespread lytic lesions are again noted throughout the visualized axial and appendicular skeleton, indicative of widespread metastatic disease to the bones. These appear relatively similar to the prior study, although there is increasing pathologic compression related to the lesion in the L1 vertebral body where there is up to 70% loss of height in the lateral aspect of the vertebral body on the right side where there is a large infiltrative mass extending into the transverse process and narrowing the spinal canal, measuring up to 6.4 x 3.8 cm (axial image 119 of series 5). There is also been some further compression at T12, now with up to 50% loss of anterior vertebral body height. Review of the MIP images confirms the above findings. IMPRESSION: 1. No evidence of pulmonary embolism. 2. Widespread metastatic disease to the bones, with increasing pathologic compression fractures at T12 and L1, as above. 3. Small amount of subsegmental atelectasis in the left lower lobe is new compared to the prior study. Electronically Signed   By: DVinnie LangtonM.D.   On: 02/05/2021 15:00        Scheduled Meds:   bictegravir-emtricitabine-tenofovir AF  1 tablet Oral Daily   docusate sodium  100 mg Oral BID   enoxaparin (LOVENOX) injection  40 mg Subcutaneous Q24H   fentaNYL  1 patch Transdermal Q72H   polyethylene glycol  17 g Oral BID   Continuous Infusions:  sodium chloride 75 mL/hr at 02/07/21 0630     LOS: 1 day    Time spent: 349mutes    ViHosie PoissonMD [facility]   To contact the attending provider between 7A-7P or the covering provider during after hours 7P-7A, please log into the web site [email or web address] and access using universal [place] password for that web site. If you do not have the password, please call the hospital operator.  02/07/2021, 1:00 PM

## 2021-02-07 NOTE — Progress Notes (Signed)
Palliative care brief note  Palliative care consult received.  Chart reviewed.  I attempted to meet today with Ashley Ortega but she was receiving personal care not available.  We are also waiting for Dr. Lindi Adie to have the opportunity to evaluate her.  Plan to follow-up either later this afternoon or tomorrow for initial consultation.  Micheline Rough, MD [facility] [phone]  NO CHARGE NOTE

## 2021-02-07 NOTE — Plan of Care (Signed)
  Problem: Education: Goal: Knowledge of General Education information will improve Description: Including pain rating scale, medication(s)/side effects and non-pharmacologic comfort measures Outcome: Progressing   Problem: Clinical Measurements: Goal: Will remain free from infection Outcome: Progressing Goal: Respiratory complications will improve Outcome: Progressing   Problem: Nutrition: Goal: Adequate nutrition will be maintained Outcome: Progressing   Problem: Pain Managment: Goal: General experience of comfort will improve Outcome: Progressing

## 2021-02-08 ENCOUNTER — Encounter (HOSPITAL_COMMUNITY): Payer: Self-pay | Admitting: Internal Medicine

## 2021-02-08 ENCOUNTER — Inpatient Hospital Stay (HOSPITAL_COMMUNITY): Payer: Medicaid Other

## 2021-02-08 DIAGNOSIS — R509 Fever, unspecified: Secondary | ICD-10-CM

## 2021-02-08 LAB — URINALYSIS, ROUTINE W REFLEX MICROSCOPIC
Bilirubin Urine: NEGATIVE
Glucose, UA: NEGATIVE mg/dL
Hgb urine dipstick: NEGATIVE
Ketones, ur: NEGATIVE mg/dL
Leukocytes,Ua: NEGATIVE
Nitrite: NEGATIVE
Protein, ur: NEGATIVE mg/dL
Specific Gravity, Urine: 1.01 (ref 1.005–1.030)
pH: 7 (ref 5.0–8.0)

## 2021-02-08 LAB — BASIC METABOLIC PANEL
Anion gap: 9 (ref 5–15)
BUN: 7 mg/dL (ref 6–20)
CO2: 27 mmol/L (ref 22–32)
Calcium: 10 mg/dL (ref 8.9–10.3)
Chloride: 103 mmol/L (ref 98–111)
Creatinine, Ser: 0.63 mg/dL (ref 0.44–1.00)
GFR, Estimated: >60 mL/min (ref >60)
Glucose, Bld: 107 mg/dL — ABNORMAL HIGH (ref 70–99)
Potassium: 3.1 mmol/L — ABNORMAL LOW (ref 3.5–5.1)
Sodium: 139 mmol/L (ref 135–145)

## 2021-02-08 LAB — PHOSPHORUS: Phosphorus: 1.9 mg/dL — ABNORMAL LOW (ref 2.5–4.6)

## 2021-02-08 LAB — CBC
HCT: 29.2 % — ABNORMAL LOW (ref 36.0–46.0)
Hemoglobin: 9.5 g/dL — ABNORMAL LOW (ref 12.0–15.0)
MCH: 27.9 pg (ref 26.0–34.0)
MCHC: 32.5 g/dL (ref 30.0–36.0)
MCV: 85.9 fL (ref 80.0–100.0)
Platelets: 426 10*3/uL — ABNORMAL HIGH (ref 150–400)
RBC: 3.4 MIL/uL — ABNORMAL LOW (ref 3.87–5.11)
RDW: 15.8 % — ABNORMAL HIGH (ref 11.5–15.5)
WBC: 6.8 10*3/uL (ref 4.0–10.5)
nRBC: 0 % (ref 0.0–0.2)

## 2021-02-08 LAB — MAGNESIUM: Magnesium: 2.2 mg/dL (ref 1.7–2.4)

## 2021-02-08 MED ORDER — SODIUM CHLORIDE 0.9 % IV SOLN
1.0000 g | Freq: Every day | INTRAVENOUS | Status: AC
  Administered 2021-02-08 – 2021-02-12 (×5): 1 g via INTRAVENOUS
  Filled 2021-02-08: qty 10
  Filled 2021-02-08 (×4): qty 1

## 2021-02-08 MED ORDER — SODIUM CHLORIDE 0.9 % IV SOLN
500.0000 mg | Freq: Every day | INTRAVENOUS | Status: AC
  Administered 2021-02-08 – 2021-02-12 (×5): 500 mg via INTRAVENOUS
  Filled 2021-02-08 (×5): qty 500

## 2021-02-08 MED ORDER — ENSURE ENLIVE PO LIQD
237.0000 mL | Freq: Two times a day (BID) | ORAL | Status: DC
  Administered 2021-02-08 – 2021-02-09 (×3): 237 mL via ORAL

## 2021-02-08 MED ORDER — POTASSIUM CHLORIDE CRYS ER 20 MEQ PO TBCR
40.0000 meq | EXTENDED_RELEASE_TABLET | Freq: Once | ORAL | Status: AC
  Administered 2021-02-08: 40 meq via ORAL
  Filled 2021-02-08: qty 2

## 2021-02-08 MED ORDER — ADULT MULTIVITAMIN W/MINERALS CH
1.0000 | ORAL_TABLET | Freq: Every day | ORAL | Status: DC
  Administered 2021-02-08 – 2021-02-15 (×8): 1 via ORAL
  Filled 2021-02-08 (×8): qty 1

## 2021-02-08 NOTE — Progress Notes (Signed)
Consultation Note Date: 02/08/2021   Patient Name: Ashley Ortega  DOB: 1970/08/14  MRN: [id number]  Age / Sex: 50 y.o., female  PCP: Pcp, No Referring Physician: Hosie Poisson, MD  Reason for Consultation: Establishing goals of care and Pain control  HPI/Patient Profile: 50 y.o. 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   Clinical Assessment and Goals of Care: Palliative care consult received.  Chart reviewed including personal review of pertinent labs and imaging.  I met today with Ashley Ortega.  I introduced palliative care as specialized medical care for people living with serious illness. It focuses on providing relief from the symptoms and stress of a serious illness. The goal is to improve quality of life for both the patient and the family.  We discussed clinical course as well as wishes moving forward in regard to advanced directives.  Concepts specific to code status, care plan this hospitalization, and rehospitalization discussed.  We discussed difference between a aggressive medical intervention path and a palliative, comfort focused care path.  Values and goals of care important to patient and family were attempted to be elicited.   Concept of Hospice and Palliative Care were discussed   Questions and concerns addressed.   PMT will continue to support holistically.   SUMMARY OF RECOMMENDATIONS   -Full code/full scope treatment -She is hopeful to get biopsy done and follow-up with Dr. Lindi Adie to discuss possible systemic therapies.  She tells me that she has always been open to chemotherapy in the past, but she did not tolerate it.  She is still hopeful to  pursue further disease modifying therapy for her cancer. -Ashley Ortega is not interested in consideration for hospice services at this time. -She requests SCDs at night as her feet wake up stiff in the morning without them. -She has completed advanced care planning paperwork naming her sister as her surrogate decision maker in the event she cannot make her own decisions.  This is available through ACP tab in epic. -Pain: Cancer-related.  She has been restarted on fentanyl 50 mcg/h and has been getting oxycodone 10 mg every 6 hours as needed for breakthrough pain.  She reports the oxycodone sometimes does not hold her until she is due for her next dose.  I increased the frequency of oxycodone to oxycodone 10 mg every 4 hours as needed for breakthrough pain. -Constipation, opioid related: We will plan for addition of senna 2 tabs twice daily.  Also continue MiraLAX.  I did D/C Colace as this has not been shown to be beneficial in opioid related constipation.  Code Status/Advance Care Planning: Full code  Symptom Management:  As above  Palliative Prophylaxis:  Bowel Regimen and Frequent Pain Assessment  Additional Recommendations (Limitations, Scope, Preferences): Full Scope Treatment  Psycho-social/Spiritual:  Desire for further Chaplaincy support: Did  not address today  Prognosis:  Guarded  Discharge Planning: To Be Determined      Primary Diagnoses: Present on Admission:  Metastatic cancer [facility])  Metastatic malignant neoplasm ([place])  Cancer related pain  Constipation  HIV (human immunodeficiency virus infection) ([place])   I have reviewed the medical record, interviewed the patient and family, and examined the patient. The following aspects are pertinent.  History reviewed. No pertinent past medical history. Social History   Socioeconomic History   Marital status: Unknown    Spouse name: Not on file   Number of children: Not on file   Years of education: Not on file    Highest education level: Not on file  Occupational History   Not on file  Tobacco Use   Smoking status: Not on file   Smokeless tobacco: Not on file  Substance and Sexual Activity   Alcohol use: Not on file   Drug use: Not on file   Sexual activity: Not on file  Other Topics Concern   Not on file  Social History Narrative   Not on file   Social Determinants of Health   Financial Resource Strain: Not on file  Food Insecurity: Not on file  Transportation Needs: Not on file  Physical Activity: Not on file  Stress: Not on file  Social Connections: Not on file   History reviewed. No pertinent family history. Scheduled Meds:  bictegravir-emtricitabine-tenofovir AF  1 tablet Oral Daily   enoxaparin (LOVENOX) injection  40 mg Subcutaneous Q24H   fentaNYL  1 patch Transdermal Q72H   polyethylene glycol  17 g Oral BID   senna  2 tablet Oral BID   Continuous Infusions:  sodium chloride 75 mL/hr at 02/07/21 0630   PRN Meds:.acetaminophen **OR** acetaminophen, ALPRAZolam, alum & mag hydroxide-simeth, morphine injection, ondansetron **OR** ondansetron (ZOFRAN) IV, oxyCODONE Medications Prior to Admission:  Prior to Admission medications   Medication Sig Start Date End Date Taking? Authorizing Provider  bictegravir-emtricitabine-tenofovir AF (BIKTARVY) 50-200-25 MG TABS tablet Take 1 tablet by mouth daily. 10/09/20  Yes [provider]  bisacodyl (DULCOLAX) 10 MG suppository Place 1 suppository (10 mg total) rectally daily as needed for moderate constipation. 01/21/21  Yes [place] Eric J, DO  fentaNYL (DURAGESIC) 25 MCG/HR Place 1 patch onto the skin every 3 (three) days. 01/23/21  Yes Nicholas Lose, MD  Oxycodone HCl 10 MG TABS Take 1 tablet (10 mg total) by mouth every 6 (six) hours as needed. 01/29/21  Yes Nicholas Lose, MD  polyethylene glycol (MIRALAX / GLYCOLAX) 17 g packet Take 17 g by mouth 2 (two) times daily. 01/21/21 04/21/21 Yes [place] Eric J, DO  LORazepam (ATIVAN)  0.5 MG tablet Take 1 tablet (0.5 mg total) by mouth at bedtime as needed for anxiety. 02/05/21 03/07/21  Nicholas Lose, MD   No Known Allergies Review of Systems  Constitutional:  Positive for activity change, appetite change and fatigue.  Gastrointestinal:  Positive for abdominal distention, abdominal pain and constipation.  Neurological:  Positive for weakness.  Psychiatric/Behavioral:  Positive for sleep disturbance.    Physical Exam General: Alert, awake, in moderate distress.   HEENT: No bruits, no goiter, no JVD Heart: Regular rate and rhythm. No murmur appreciated. Lungs: Good air movement, clear Abdomen: Soft, nontender, distended, positive bowel sounds.   Ext: No significant edema Skin: Warm and dry Neuro: Grossly intact, nonfocal.   Vital Signs: BP 123/79 (BP Location: Left Arm)   Pulse (!) 137   Temp 99.1 F (37.3 C) (Oral)  Resp 17   SpO2 97%  Pain Scale: 0-10 POSS *See Group Information*: 1-Acceptable,Awake and alert Pain Score: 10-Worst pain ever   SpO2: SpO2: 97 % O2 Device:SpO2: 97 % O2 Flow Rate: .   IO: Intake/output summary:  Intake/Output Summary (Last 24 hours) at 02/08/2021 2346 Last data filed at 02/08/2021 0531 Gross per 24 hour  Intake 120 ml  Output 900 ml  Net -780 ml    LBM: Last BM Date: 02/05/21 Baseline Weight:   Most recent weight:       Palliative Assessment/Data:   Flowsheet Rows    Flowsheet Row Most Recent Value  Intake Tab   Referral Department Hospitalist  Unit at Time of Referral Oncology Unit  Palliative Care Primary Diagnosis Cancer  Date Notified 02/06/21  Palliative Care Type New Palliative care  Reason for referral Clarify Goals of Care, Pain  Date of Admission 02/05/21  Date first seen by Palliative Care 02/07/21  # of days Palliative referral response time 1 Day(s)  # of days IP prior to Palliative referral 1  Clinical Assessment   Palliative Performance Scale Score 40%  Psychosocial & Spiritual Assessment    Palliative Care Outcomes   Patient/Family meeting held? Yes  Who was at the meeting? Patient  Palliative Care Outcomes Clarified goals of care, Improved pain interventions       Time In: 1340 Time Out: 1455 Time Total: 75 Greater than 50%  of this time was spent counseling and coordinating care related to the above assessment and plan.  Signed by: Micheline Rough, MD   Please contact Palliative Medicine Team phone at [phone] for questions and concerns.  For individual provider: See Shea Evans

## 2021-02-08 NOTE — Plan of Care (Signed)
Patient ID: Ashley Ortega, female   DOB: [birth year], 50 y.o.   MRN: [id number]    Problem: Education: Goal: Knowledge of General Education information will improve Description: Including pain rating scale, medication(s)/side effects and non-pharmacologic comfort measures Outcome: Progressing   Problem: Health Behavior/Discharge Planning: Goal: Ability to manage health-related needs will improve Outcome: Progressing   Problem: Clinical Measurements: Goal: Ability to maintain clinical measurements within normal limits will improve Outcome: Progressing Goal: Will remain free from infection Outcome: Progressing Goal: Diagnostic test results will improve Outcome: Progressing Goal: Respiratory complications will improve Outcome: Progressing Goal: Cardiovascular complication will be avoided Outcome: Progressing   Problem: Activity: Goal: Risk for activity intolerance will decrease Outcome: Progressing   Problem: Nutrition: Goal: Adequate nutrition will be maintained Outcome: Progressing   Problem: Coping: Goal: Level of anxiety will decrease Outcome: Progressing   Problem: Elimination: Goal: Will not experience complications related to bowel motility Outcome: Progressing Goal: Will not experience complications related to urinary retention Outcome: Progressing   Problem: Pain Managment: Goal: General experience of comfort will improve Outcome: Progressing   Problem: Safety: Goal: Ability to remain free from injury will improve Outcome: Progressing   Problem: Skin Integrity: Goal: Risk for impaired skin integrity will decrease Outcome: Progressing    Haydee Salter, RN

## 2021-02-08 NOTE — Plan of Care (Signed)

## 2021-02-08 NOTE — Progress Notes (Signed)
Patient ID: Ashley Ortega, female   DOB: 03-May-1971, 50 y.o.   MRN: [id number]    02/08/21 1405  Vitals  Temp (!) 102.1 F (38.9 C)  Temp Source Oral  BP 133/86  MAP (mmHg) 98  BP Location Left Arm  BP Method Automatic  Patient Position (if appropriate) Lying  Pulse Rate (!) 133  Pulse Rate Source Dinamap  Resp 16  MEWS COLOR  MEWS Score Color Red  Oxygen Therapy  SpO2 97 %  O2 Device Room Air  MEWS Score  MEWS Temp 2  MEWS Systolic 0  MEWS Pulse 3  MEWS RR 0  MEWS LOC 0  MEWS Score 5  MD aware. See orders. Haydee Salter, RN

## 2021-02-08 NOTE — Progress Notes (Signed)
Initial Nutrition Assessment  DOCUMENTATION CODES:   Non-severe (moderate) malnutrition in context of chronic illness  INTERVENTION:   -Ensure Plus PO BID, each provides 350 kcals and 13g protein  -Multivitamin with minerals daily  -Needs weight for admission (last recorded 8/24)  NUTRITION DIAGNOSIS:   Moderate Malnutrition related to chronic illness, cancer and cancer related treatments as evidenced by moderate fat depletion, mild muscle depletion, energy intake < or equal to 75% for > or equal to 1 month.  GOAL:   Patient will meet greater than or equal to 90% of their needs  MONITOR:   PO intake, Supplement acceptance, Labs, Weight trends, I & O's  REASON FOR ASSESSMENT:   Consult Assessment of nutrition requirement/status  ASSESSMENT:   50 y.o. female with medical history significant of metastatic breast cancer, incomplete chemotherapies in the past, metastasis to multiple bones/ vertebrae and liver who was here today for liver biopsy, found to have too much pain and discomfort so sent to emergency room.  Patient with poor appetite, having some constipation issues. Pt was not eating for 5 days PTA. Only consuming water. Last recorded meal for this admission was 90% of breakfast on 9/8.  Will order Ensure supplements for additional kcals and protein.  Per weight records, last recorded weight is 108 lbs on 8/24. Needs weight for admission.   Medications: Miralax, Senokot  Labs reviewed:  Low K  NUTRITION - FOCUSED PHYSICAL EXAM:  Flowsheet Row Most Recent Value  Orbital Region Mild depletion  Upper Arm Region Moderate depletion  Thoracic and Lumbar Region Unable to assess  Buccal Region Mild depletion  Temple Region Mild depletion  Clavicle Bone Region Mild depletion  Clavicle and Acromion Bone Region No depletion  Scapular Bone Region No depletion  Dorsal Hand Mild depletion  Patellar Region Unable to assess  Anterior Thigh Region Unable to assess   Posterior Calf Region Unable to assess  Edema (RD Assessment) None       Diet Order:   Diet Order             Diet regular Room service appropriate? Yes; Fluid consistency: Thin  Diet effective now                   EDUCATION NEEDS:   No education needs have been identified at this time  Skin:  Skin Assessment: Reviewed RN Assessment  Last BM:  9/6  Height:   Ht Readings from Last 1 Encounters:  01/23/21 '5\' 6"'$  (1.676 m)    Weight:   Wt Readings from Last 1 Encounters:  01/23/21 49.4 kg    BMI:  There is no height or weight on file to calculate BMI.  Estimated Nutritional Needs:   Kcal:  1600-1800  Protein:  70-85g  Fluid:  1.8L/day   Clayton Bibles, MS, RD, LDN Inpatient Clinical Dietitian Contact information available via Amion

## 2021-02-08 NOTE — Progress Notes (Signed)
PROGRESS NOTE    Ashley Ortega  [id number] DOB: 1970-11-07 DOA: 02/05/2021 PCP: Pcp, No (   Chief Complaint  Patient presents with   Shortness of Breath    Brief Narrative:  Ashley Ortega is a 50 y.o. female with medical history significant of metastatic breast cancer, incomplete chemotherapies in the past, metastasis to multiple bones/ vertebrae and liver who was here today for liver biopsy, found to have too much pain and discomfort so sent to emergency room.  Patient was in the ED for similar complaints recently and was discharged home on pain medications.  Patient was diagnosed with metastatic breast cancer in 2020.  She received 1 dose of IV chemotherapy and was not able to tolerate any further chemo's.  She was also recently found to have metastatic lesions in the liver.  And was scheduled for liver biopsy.  Patient was deemed not medically stable for procedure due to persistent pain and she was sent to ED for evaluation of pain control and dehydration.  Pain is better controlled but she started spiking fevers initially thought it to be cancer related fevers. She denies any chest pain or cough she has pelvic pain.  Assessment & Plan:   Principal Problem:   Cancer related pain Active Problems:   Metastatic malignant neoplasm ([place])   Metastatic cancer ([facility])   Constipation   HIV (human immunodeficiency virus infection) ([place])   Stage IV metastatic breast cancer with mets to the liver and bone coming with severe pain, failure to thrive, severe protein calorie malnutrition. -Aggressive pain control with fentanyl patch 50 MCG, IV morphine every 2 hours as needed and restarted her home dose of oxycodone 10 mg every 6 hours as needed Aggressive laxative regimen added Palliative care consulted by admitting physician for goals of care discussion and symptom management. Appreciate oncology in put.   Constipation:  Added laxatives.    Anxiety:  Added prn xanax.     Anemia  of chronic disease Baseline hemoglobin around 8. Hemoglobin on admission probably hemoconcentrated sample due to dehydration.   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  Protein calorie malnutrition:  Dietary consulted .     Failure to thrive.  Probably secondary to  liver mets Palliative care consulted for goals of care discussion.   Fever Unclear etiology she is got some left  lower lobe atelectasis vs infiltrate on CT chest  will empirically treat for pneumonia. Get blood cultures, urine analysis ,urine cultures and repeat chest x-ray.pt reports pelvic pain, will get x rays of the pelvis.   Hypokalemia Replaced.  Liver mets She is scheduled for biopsy by IR as outpatient.  DVT prophylaxis: (Lovenox) Code Status: (Full code) Family Communication: ( none at bedside) Disposition:   Status is: Inpatient.   The patient will require care spanning > 2 midnights and should be moved to inpatient because: Unsafe d/c plan and IV treatments appropriate due to intensity of illness or inability to take PO  Dispo: The patient is from: Home              Anticipated d/c is to: Home              Patient currently is not medically stable to d/c.   Difficult to place patient No       Consultants:  Palliative care consult.  Oncology  Procedures: None.   Antimicrobials: none.    Subjective: Pain in the pelvic area severe  Objective: Vitals:   02/07/21 2042 02/07/21 2350 02/08/21  0533 02/08/21 1405  BP: 139/83  123/79 133/86  Pulse: (!) 119  (!) 137 (!) 133  Resp: '18  17 16  '$ Temp: (!) 101.3 F (38.5 C) 99.6 F (37.6 C) 99.1 F (37.3 C) (!) 102.1 F (38.9 C)  TempSrc: Oral Oral Oral Oral  SpO2: 97%  97% 97%    Intake/Output Summary (Last 24 hours) at 02/08/2021 1445 Last data filed at 02/08/2021 0531 Gross per 24 hour  Intake --  Output 900 ml  Net -900 ml    There were no vitals filed for this visit.  Examination:  General exam: ill appearing lady,  mildly distressed from pelvic pain Respiratory system: Diminished air entry at bases no wheezing heard on room air Cardiovascular system: S1-S2 heard, tachycardic, no JVD or no pedal edema soft Gastrointestinal system: Abdomen is soft, nontender, bowel sounds normal alert Central nervous system: Alert and oriented, grossly nonfocal Extremities: No pedal edema Skin: No rashes seen Psychiatry: Anxious    Data Reviewed: I have personally reviewed following labs and imaging studies  CBC: Recent Labs  Lab 02/05/21 1214 02/06/21 0518 02/08/21 0517  WBC 8.0 5.2 6.8  NEUTROABS 6.5  --   --   HGB 11.2* 8.4* 9.5*  HCT 34.5* 24.9* 29.2*  MCV 85.8 84.1 85.9  PLT 505* 375 426*     Basic Metabolic Panel: Recent Labs  Lab 02/05/21 1214 02/06/21 0518 02/08/21 0517  NA 135 136 139  K 3.5 3.7 3.1*  CL 99 105 103  CO2 '25 25 27  '$ GLUCOSE 108* 87 107*  BUN '11 7 7  '$ CREATININE 0.58 0.51 0.63  CALCIUM 10.8* 9.7 10.0  MG  --  2.0  --   PHOS  --  2.5  --      GFR: CrCl cannot be calculated (Unknown ideal weight.).  Liver Function Tests: Recent Labs  Lab 02/05/21 1214  AST 46*  ALT 17  ALKPHOS 203*  BILITOT 0.6  PROT 9.8*  ALBUMIN 3.4*     CBG: No results for input(s): GLUCAP in the last 168 hours.   Recent Results (from the past 240 hour(s))  Resp Panel by RT-PCR (Flu A&B, Covid) Nasopharyngeal Swab     Status: None   Collection Time: 02/05/21 12:15 PM   Specimen: Nasopharyngeal Swab; Nasopharyngeal(NP) swabs in vial transport medium  Result Value Ref Range Status   SARS Coronavirus 2 by RT PCR NEGATIVE NEGATIVE Final    Comment: (NOTE) SARS-CoV-2 target nucleic acids are NOT DETECTED.  The SARS-CoV-2 RNA is generally detectable in upper respiratory specimens during the acute phase of infection. The lowest concentration of SARS-CoV-2 viral copies this assay can detect is 138 copies/mL. A negative result does not preclude SARS-Cov-2 infection and should not be used  as the sole basis for treatment or other patient management decisions. A negative result may occur with  improper specimen collection/handling, submission of specimen other than nasopharyngeal swab, presence of viral mutation(s) within the areas targeted by this assay, and inadequate number of viral copies(<138 copies/mL). A negative result must be combined with clinical observations, patient history, and epidemiological information. The expected result is Negative.  Fact Sheet for Patients:  EntrepreneurPulse.com.au  Fact Sheet for Healthcare Providers:  IncredibleEmployment.be  This test is no t yet approved or cleared by the [place] FDA and  has been authorized for detection and/or diagnosis of SARS-CoV-2 by FDA under an Emergency Use Authorization (EUA). This EUA will remain  in effect (meaning this test can be used)  for the duration of the COVID-19 declaration under Section 564(b)(1) of the Act, 21 U.S.C.section 360bbb-3(b)(1), unless the authorization is terminated  or revoked sooner.       Influenza A by PCR NEGATIVE NEGATIVE Final   Influenza B by PCR NEGATIVE NEGATIVE Final    Comment: (NOTE) The Xpert Xpress SARS-CoV-2/FLU/RSV plus assay is intended as an aid in the diagnosis of influenza from Nasopharyngeal swab specimens and should not be used as a sole basis for treatment. Nasal washings and aspirates are unacceptable for Xpert Xpress SARS-CoV-2/FLU/RSV testing.  Fact Sheet for Patients: EntrepreneurPulse.com.au  Fact Sheet for Healthcare Providers: IncredibleEmployment.be  This test is not yet approved or cleared by the [place] FDA and has been authorized for detection and/or diagnosis of SARS-CoV-2 by FDA under an Emergency Use Authorization (EUA). This EUA will remain in effect (meaning this test can be used) for the duration of the COVID-19 declaration under Section 564(b)(1)  of the Act, 21 U.S.C. section 360bbb-3(b)(1), unless the authorization is terminated or revoked.  Performed at [facility], [place] [address] [place], [place] [address]           Radiology Studies: No results found.      Scheduled Meds:  bictegravir-emtricitabine-tenofovir AF  1 tablet Oral Daily   enoxaparin (LOVENOX) injection  40 mg Subcutaneous Q24H   feeding supplement  237 mL Oral BID BM   fentaNYL  1 patch Transdermal Q72H   multivitamin with minerals  1 tablet Oral Daily   polyethylene glycol  17 g Oral BID   potassium chloride  40 mEq Oral Once   senna  2 tablet Oral BID   Continuous Infusions:  sodium chloride 75 mL/hr at 02/07/21 0630   azithromycin     cefTRIAXone (ROCEPHIN)  IV       LOS: 2 days       Hosie Poisson, MD [facility]   To contact the attending provider between 7A-7P or the covering provider during after hours 7P-7A, please log into the web site [email or web address] and access using universal [place] password for that web site. If you do not have the password, please call the hospital operator.  02/08/2021, 2:45 PM

## 2021-02-08 NOTE — Plan of Care (Signed)
Patient ID: Laelynn Deschaine, female   DOB: September 09, 1970, 50 y.o.   MRN: [id number]   Problem: Education: Goal: Knowledge of General Education information will improve Description: Including pain rating scale, medication(s)/side effects and non-pharmacologic comfort measures 02/08/2021 1725 by Haydee Salter, RN Outcome: Progressing 02/08/2021 1316 by Haydee Salter, RN Outcome: Progressing   Problem: Health Behavior/Discharge Planning: Goal: Ability to manage health-related needs will improve 02/08/2021 1725 by Haydee Salter, RN Outcome: Progressing 02/08/2021 1316 by Haydee Salter, RN Outcome: Progressing   Problem: Clinical Measurements: Goal: Ability to maintain clinical measurements within normal limits will improve 02/08/2021 1725 by Haydee Salter, RN Outcome: Progressing 02/08/2021 1316 by Patterson Hammersmith D, RN Outcome: Progressing Goal: Will remain free from infection 02/08/2021 1725 by Haydee Salter, RN Outcome: Progressing 02/08/2021 1316 by Patterson Hammersmith D, RN Outcome: Progressing  Temperature of 102 resolved with Tylenol. ABT started. Education provided on infection prevention.  Goal: Diagnostic test results will improve 02/08/2021 1725 by Haydee Salter, RN Outcome: Progressing 02/08/2021 1316 by Patterson Hammersmith D, RN Outcome: Progressing Goal: Respiratory complications will improve 02/08/2021 1725 by Haydee Salter, RN Outcome: Progressing 02/08/2021 1316 by Patterson Hammersmith D, RN Outcome: Progressing Goal: Cardiovascular complication will be avoided 02/08/2021 1725 by Haydee Salter, RN Outcome: Progressing 02/08/2021 1316 by Haydee Salter, RN Outcome: Progressing   Problem: Activity: Goal: Risk for activity intolerance will decrease 02/08/2021 1725 by Haydee Salter, RN Outcome: Progressing 02/08/2021 1316 by Patterson Hammersmith D, RN Outcome: Progressing   Problem: Nutrition: Goal: Adequate nutrition will be maintained 02/08/2021 1725 by Haydee Salter, RN Outcome: Progressing 02/08/2021 1316 by Haydee Salter, RN Outcome: Progressing  Started in supplements   Problem: Coping: Goal: Level of anxiety will decrease Outcome: Progressing  Utilizing prn anxiety medication. Prayer with NT.  Problem: Elimination: Goal: Will not experience complications related to bowel motility Outcome: Progressing Goal: Will not experience complications related to urinary retention Outcome: Progressing  Stool softeners/laxatives given  Problem: Pain Managment: Goal: General experience of comfort will improve Outcome: Progressing Oxycodone PRN   Problem: Safety: Goal: Ability to remain free from injury will improve Outcome: Progressing   Problem: Skin Integrity: Goal: Risk for impaired skin integrity will decrease Outcome: Progressing   Haydee Salter, RN

## 2021-02-09 LAB — BASIC METABOLIC PANEL
Anion gap: 7 (ref 5–15)
BUN: <5 mg/dL — ABNORMAL LOW (ref 6–20)
CO2: 27 mmol/L (ref 22–32)
Calcium: 9.7 mg/dL (ref 8.9–10.3)
Chloride: 99 mmol/L (ref 98–111)
Creatinine, Ser: 0.37 mg/dL — ABNORMAL LOW (ref 0.44–1.00)
GFR, Estimated: >60 mL/min (ref >60)
Glucose, Bld: 92 mg/dL (ref 70–99)
Potassium: 4 mmol/L (ref 3.5–5.1)
Sodium: 133 mmol/L — ABNORMAL LOW (ref 135–145)

## 2021-02-09 LAB — CBC WITH DIFFERENTIAL/PLATELET
Abs Immature Granulocytes: 0.03 10*3/uL (ref 0.00–0.07)
Basophils Absolute: 0 10*3/uL (ref 0.0–0.1)
Basophils Relative: 0 %
Eosinophils Absolute: 0.1 10*3/uL (ref 0.0–0.5)
Eosinophils Relative: 1 %
HCT: 27.2 % — ABNORMAL LOW (ref 36.0–46.0)
Hemoglobin: 8.8 g/dL — ABNORMAL LOW (ref 12.0–15.0)
Immature Granulocytes: 0 %
Lymphocytes Relative: 12 %
Lymphs Abs: 0.9 10*3/uL (ref 0.7–4.0)
MCH: 27.5 pg (ref 26.0–34.0)
MCHC: 32.4 g/dL (ref 30.0–36.0)
MCV: 85 fL (ref 80.0–100.0)
Monocytes Absolute: 0.8 10*3/uL (ref 0.1–1.0)
Monocytes Relative: 11 %
Neutro Abs: 5.5 10*3/uL (ref 1.7–7.7)
Neutrophils Relative %: 76 %
Platelets: 267 10*3/uL (ref 150–400)
RBC: 3.2 MIL/uL — ABNORMAL LOW (ref 3.87–5.11)
RDW: 15.7 % — ABNORMAL HIGH (ref 11.5–15.5)
WBC: 7.3 10*3/uL (ref 4.0–10.5)
nRBC: 0 % (ref 0.0–0.2)

## 2021-02-09 MED ORDER — K PHOS MONO-SOD PHOS DI & MONO 155-852-130 MG PO TABS
500.0000 mg | ORAL_TABLET | Freq: Three times a day (TID) | ORAL | Status: DC
  Administered 2021-02-09 – 2021-02-15 (×17): 500 mg via ORAL
  Filled 2021-02-09 (×21): qty 2

## 2021-02-09 NOTE — Progress Notes (Signed)
Pt's HR elevated triggering yellow MEWS protocol. Unchanged assessment from prior. PRN pain medication given. Will continue to monitor.   MEWS Guidelines - (patients age 48 and over)    02/09/21 0733  Assess: MEWS Score  Level of Consciousness Alert  O2 Device Room Air  Patient Activity (if Appropriate) In bed  Assess: MEWS Score  MEWS Temp 0  MEWS Systolic 0  MEWS Pulse 2  MEWS RR 0  MEWS LOC 0  MEWS Score 2  MEWS Score Color Yellow  Assess: if the MEWS score is Yellow or Red  Were vital signs taken at a resting state? Yes  Focused Assessment No change from prior assessment  Does the patient meet 2 or more of the SIRS criteria? No  Does the patient have a confirmed or suspected source of infection? No  Provider and Rapid Response Notified? No  MEWS guidelines implemented *See Row Information* No, previously yellow, continue vital signs every 4 hours  Treat  MEWS Interventions Administered prn meds/treatments  Pain Scale 0-10  Pain Score 2  Pain Type Acute pain  Pain Location Pelvis  Pain Orientation Right;Left  Pain Descriptors / Indicators Aching  Pain Frequency Constant  Pain Onset On-going  Patients Stated Pain Goal 2  Pain Intervention(s) Medication (See eMAR)  Multiple Pain Sites No  Complains of Anxiety  Neuro symptoms relieved by Anti-anxiety medication  Constipation interventions Stool Softener;Laxative  Gas relieved by Anti-gas agent  Patients response to intervention Effective  Take Vital Signs  Increase Vital Sign Frequency  Yellow: Q 2hr X 2 then Q 4hr X 2, if remains yellow, continue Q 4hrs  Escalate  MEWS: Escalate Yellow: discuss with charge nurse/RN and consider discussing with provider and RRT  Notify: Charge Nurse/RN  Name of Charge Nurse/RN Notified Keiva RN  Date Charge Nurse/RN Notified 02/09/21  Time Charge Nurse/RN Notified 0735  Document  Patient Outcome Stabilized after interventions  Progress note created (see row info) Yes

## 2021-02-09 NOTE — Progress Notes (Signed)
Pt's HR remains elevated, triggering yellow MEWS protocol. Assessment unchanged from previous. Will continue to monitor.    02/09/21 1434  Assess: MEWS Score  Temp 98.9 F (37.2 C)  BP 124/80  Pulse Rate (!) 115  Resp 16  SpO2 100 %  O2 Device Room Air  Patient Activity (if Appropriate) In bed  Assess: MEWS Score  MEWS Temp 0  MEWS Systolic 0  MEWS Pulse 2  MEWS RR 0  MEWS LOC 0  MEWS Score 2  MEWS Score Color Yellow  Assess: if the MEWS score is Yellow or Red  Were vital signs taken at a resting state? Yes  Focused Assessment No change from prior assessment  Does the patient meet 2 or more of the SIRS criteria? No  Does the patient have a confirmed or suspected source of infection? No  Provider and Rapid Response Notified? No  MEWS guidelines implemented *See Row Information* No, previously yellow, continue vital signs every 4 hours  Treat  MEWS Interventions Administered scheduled meds/treatments  Pain Scale 0-10  Pain Score 10  Pain Type Acute pain  Pain Location Pelvis  Pain Orientation Lower  Pain Descriptors / Indicators Pressure  Pain Frequency Constant  Pain Onset On-going  Patients Stated Pain Goal 3  Pain Intervention(s) Medication (See eMAR)  Multiple Pain Sites No  Complains of Anxiety  Neuro symptoms relieved by Anti-anxiety medication  Constipation interventions Stool Softener;Laxative  Gas relieved by Laxative  Patients response to intervention Effective  Take Vital Signs  Increase Vital Sign Frequency  Yellow: Q 2hr X 2 then Q 4hr X 2, if remains yellow, continue Q 4hrs  Escalate  MEWS: Escalate Yellow: discuss with charge nurse/RN and consider discussing with provider and RRT  Notify: Charge Nurse/RN  Name of Charge Nurse/RN Notified  Medical sales representative RN)  Date Charge Nurse/RN Notified 02/09/21  Time Charge Nurse/RN Notified 1622  Document  Patient Outcome Stabilized after interventions  Progress note created (see row info) Yes  Assess: SIRS CRITERIA   SIRS Temperature  0  SIRS Pulse 1  SIRS Respirations  0  SIRS WBC 0  SIRS Score Sum  1  MEWS Guidelines - (patients age 4 and over)

## 2021-02-09 NOTE — Progress Notes (Signed)
PROGRESS NOTE    Ashley Ortega  [id number] DOB: [birth year] DOA: 02/05/2021 PCP: Pcp, No (   Chief Complaint  Patient presents with   Shortness of Breath    Brief Narrative:  Ashley Ortega is a 50 y.o. female with medical history significant of metastatic breast cancer, incomplete chemotherapies in the past, metastasis to multiple bones/ vertebrae and liver who was here today for liver biopsy, found to have too much pain and discomfort so sent to emergency room.  Patient was in the ED for similar complaints recently and was discharged home on pain medications.  Patient was diagnosed with metastatic breast cancer in 2020.  She received 1 dose of IV chemotherapy and was not able to tolerate any further chemo's.  She was also recently found to have metastatic lesions in the liver.  And was scheduled for liver biopsy.  Patient was deemed not medically stable for procedure due to persistent pain and she was sent to ED for evaluation of pain control and dehydration. PAIN is better controlled with the new pain regimen. Fevers have finally resolved  Assessment & Plan:   Principal Problem:   Cancer related pain Active Problems:   Metastatic malignant neoplasm ([place])   Metastatic cancer ([facility])   Constipation   HIV (human immunodeficiency virus infection) ([place])   Stage IV metastatic breast cancer with mets to the liver and bone coming with severe pain, failure to thrive, severe protein calorie malnutrition. -Aggressive pain control with fentanyl patch 50 MCG, IV morphine every 2 hours as needed and restarted her home dose of oxycodone 10 mg every 6 hours as needed Aggressive laxative regimen added by palliative care. Palliative care consulted by admitting physician for goals of care discussion and symptom management.   Constipation:  Added laxatives.  Patient is refusing any suppositories at this time   Anxiety:  Added prn xanax.     Anemia of chronic disease Baseline hemoglobin  around 8. Hemoglobin on admission probably hemoconcentrated sample due to dehydration.   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  Protein calorie malnutrition:  Dietary consulted .     Failure to thrive.  Probably secondary to  liver mets Palliative care consulted for goals of care discussion.   Fever Unclear etiology she is got some left  lower lobe atelectasis vs infiltrate on CT chest  will empirically treat for pneumonia. Urine analysis remains negative chest x-ray does not show any overt pneumonia, urine cultures and blood cultures sent and are pending. Pelvic x-rays show bone meta stasis.  Hypokalemia and hypophosphatemia Replaced.  Liver mets She is scheduled for biopsy by IR as outpatient.  Odynophagia Will check for strep throat    DVT prophylaxis: (Lovenox) Code Status: (Full code) Family Communication: ( none at bedside) Disposition:   Status is: Inpatient.   The patient will require care spanning > 2 midnights and should be moved to inpatient because: Unsafe d/c plan and IV treatments appropriate due to intensity of illness or inability to take PO  Dispo: The patient is from: Home              Anticipated d/c is to: Home              Patient currently is not medically stable to d/c.   Difficult to place patient No       Consultants:  Palliative care consult.  Oncology  Procedures: None.   Antimicrobials: none.    Subjective: Reports not doing good, she knows she is critical.  Objective: Vitals:   02/08/21 2226 02/09/21 0140 02/09/21 0608 02/09/21 1010  BP: 134/85 134/83 116/77 104/73  Pulse: (!) 125 (!) 115 (!) 118 (!) 108  Resp: '14 18 18 16  '$ Temp: 99.5 F (37.5 C) 100 F (37.8 C) 98.8 F (37.1 C) 98.7 F (37.1 C)  TempSrc: Oral Oral Oral Oral  SpO2: 99% 96% 99% 99%    Intake/Output Summary (Last 24 hours) at 02/09/2021 1415 Last data filed at 02/09/2021 1300 Gross per 24 hour  Intake 4203.04 ml  Output 1000 ml  Net  3203.04 ml    There were no vitals filed for this visit.  Examination:  General exam: Young ill-appearing lady, not in any kind of distress this morning Respiratory system: Air entry fair bilateral no wheezing or rhonchi, patient is on room air Cardiovascular system: S1-S2 heard, tachycardic no JVD or pedal edema Gastrointestinal system: Abdomen is soft, non tender non distended bowel sounds wnl. Central nervous system: Alert and oriented, non focal Extremities: no pedal edema.  Skin: No rashes seen.  Psychiatry: Anxious    Data Reviewed: I have personally reviewed following labs and imaging studies  CBC: Recent Labs  Lab 02/05/21 1214 02/06/21 0518 02/08/21 0517 02/09/21 0603  WBC 8.0 5.2 6.8 7.3  NEUTROABS 6.5  --   --  5.5  HGB 11.2* 8.4* 9.5* 8.8*  HCT 34.5* 24.9* 29.2* 27.2*  MCV 85.8 84.1 85.9 85.0  PLT 505* 375 426* 267     Basic Metabolic Panel: Recent Labs  Lab 02/05/21 1214 02/06/21 0518 02/08/21 0517 02/08/21 1635 02/09/21 0603  NA 135 136 139  --  133*  K 3.5 3.7 3.1*  --  4.0  CL 99 105 103  --  99  CO2 '25 25 27  '$ --  27  GLUCOSE 108* 87 107*  --  92  BUN '11 7 7  '$ --  <5*  CREATININE 0.58 0.51 0.63  --  0.37*  CALCIUM 10.8* 9.7 10.0  --  9.7  MG  --  2.0  --  2.2  --   PHOS  --  2.5  --  1.9*  --      GFR: CrCl cannot be calculated (Unknown ideal weight.).  Liver Function Tests: Recent Labs  Lab 02/05/21 1214  AST 46*  ALT 17  ALKPHOS 203*  BILITOT 0.6  PROT 9.8*  ALBUMIN 3.4*     CBG: No results for input(s): GLUCAP in the last 168 hours.   Recent Results (from the past 240 hour(s))  Resp Panel by RT-PCR (Flu A&B, Covid) Nasopharyngeal Swab     Status: None   Collection Time: 02/05/21 12:15 PM   Specimen: Nasopharyngeal Swab; Nasopharyngeal(NP) swabs in vial transport medium  Result Value Ref Range Status   SARS Coronavirus 2 by RT PCR NEGATIVE NEGATIVE Final    Comment: (NOTE) SARS-CoV-2 target nucleic acids are NOT  DETECTED.  The SARS-CoV-2 RNA is generally detectable in upper respiratory specimens during the acute phase of infection. The lowest concentration of SARS-CoV-2 viral copies this assay can detect is 138 copies/mL. A negative result does not preclude SARS-Cov-2 infection and should not be used as the sole basis for treatment or other patient management decisions. A negative result may occur with  improper specimen collection/handling, submission of specimen other than nasopharyngeal swab, presence of viral mutation(s) within the areas targeted by this assay, and inadequate number of viral copies(<138 copies/mL). A negative result must be combined with clinical observations, patient history, and epidemiological  information. The expected result is Negative.  Fact Sheet for Patients:  EntrepreneurPulse.com.au  Fact Sheet for Healthcare Providers:  IncredibleEmployment.be  This test is no t yet approved or cleared by the [place] FDA and  has been authorized for detection and/or diagnosis of SARS-CoV-2 by FDA under an Emergency Use Authorization (EUA). This EUA will remain  in effect (meaning this test can be used) for the duration of the COVID-19 declaration under Section 564(b)(1) of the Act, 21 U.S.C.section 360bbb-3(b)(1), unless the authorization is terminated  or revoked sooner.       Influenza A by PCR NEGATIVE NEGATIVE Final   Influenza B by PCR NEGATIVE NEGATIVE Final    Comment: (NOTE) The Xpert Xpress SARS-CoV-2/FLU/RSV plus assay is intended as an aid in the diagnosis of influenza from Nasopharyngeal swab specimens and should not be used as a sole basis for treatment. Nasal washings and aspirates are unacceptable for Xpert Xpress SARS-CoV-2/FLU/RSV testing.  Fact Sheet for Patients: EntrepreneurPulse.com.au  Fact Sheet for Healthcare Providers: IncredibleEmployment.be  This test is not yet  approved or cleared by the [place] FDA and has been authorized for detection and/or diagnosis of SARS-CoV-2 by FDA under an Emergency Use Authorization (EUA). This EUA will remain in effect (meaning this test can be used) for the duration of the COVID-19 declaration under Section 564(b)(1) of the Act, 21 U.S.C. section 360bbb-3(b)(1), unless the authorization is terminated or revoked.  Performed at [facility], [place] [address], [place], [place] [address]           Radiology Studies: DG Chest 2 View  Result Date: 02/08/2021 CLINICAL DATA:  Fever EXAM: CHEST - 2 VIEW COMPARISON:  02/05/2021 FINDINGS: Possible tiny pleural effusions on lateral view. No focal opacity or pneumothorax. Normal cardiac size. IMPRESSION: Possible trace pleural effusions.  No focal airspace disease. Electronically Signed   By: Donavan Foil M.D.   On: 02/08/2021 19:42   DG HIPS BILAT WITH PELVIS 2V  Result Date: 02/08/2021 CLINICAL DATA:  Anterior pelvic pain for the past month. History of metastatic breast cancer. EXAM: DG HIP (WITH OR WITHOUT PELVIS) 2V BILAT COMPARISON:  CT abdomen pelvis dated December 23, 2020. FINDINGS: No acute fracture or dislocation. Lytic lesions involving the right lesser tuberosity, left ischial tuberosity, left inferior pubic ramus, and bilateral sacral ala, better evaluated on most recent CT. Hip joint spaces are preserved. Soft tissues are unremarkable. IMPRESSION: 1. No acute osseous abnormality. 2. Multifocal lytic lesions, consistent with known metastatic disease, better evaluated on most recent CT. Electronically Signed   By: Titus Dubin M.D.   On: 02/08/2021 19:44        Scheduled Meds:  bictegravir-emtricitabine-tenofovir AF  1 tablet Oral Daily   enoxaparin (LOVENOX) injection  40 mg Subcutaneous Q24H   feeding supplement  237 mL Oral BID BM   fentaNYL  1 patch Transdermal Q72H   multivitamin with minerals  1 tablet Oral Daily   phosphorus  500  mg Oral TID   polyethylene glycol  17 g Oral BID   senna  2 tablet Oral BID   Continuous Infusions:  sodium chloride 75 mL/hr at 02/09/21 1023   azithromycin 500 mg (02/09/21 1200)   cefTRIAXone (ROCEPHIN)  IV 1 g (02/09/21 1100)     LOS: 3 days       Hosie Poisson, MD [facility]   To contact the attending provider between 7A-7P or the covering provider during after hours 7P-7A, please log into the web site  [email or web address] and access using universal [place] password for that web site. If you do not have the password, please call the hospital operator.  02/09/2021, 2:15 PM

## 2021-02-09 NOTE — Progress Notes (Signed)
Daily Progress Note   Patient Name: Ashley Ortega       Date: 02/09/2021 DOB: 07-Jun-1970  Age: 50 y.o. MRN#: [id number] Attending Physician: Hosie Poisson, MD Primary Care Physician: Pcp, No Admit Date: 02/05/2021  Reason for Consultation/Follow-up: Establishing goals of care  Subjective: I saw and examined Ms. Mofield today.  She had 2 friends from [place] came to visit her and she was in much better spirits and appeared much brighter today overall.  She tells me that her pain is currently fairly well controlled.  We reviewed pain management including use of short and long-acting medications per friends request to go over pain management plan again with Ms. Koskinen to make sure she understands completely.  We also discussed that she has short acting IV medication available if pain becomes acutely worse.  We discussed that she was having an increase in pelvic pain and x-rays of this are pending.  We also discussed constipation.  I did increase bowel regimen yesterday to MiraLAX twice daily and senna 2 tabs twice daily.  Discussed we may need to consider suppository or enema tomorrow if she does not have a bowel movement.  She reports things are "gurgling" and think she just needs to be up and more active to have a bowel movement.  Length of Stay: 3  Current Medications: Scheduled Meds:  . bictegravir-emtricitabine-tenofovir AF  1 tablet Oral Daily  . enoxaparin (LOVENOX) injection  40 mg Subcutaneous Q24H  . feeding supplement  237 mL Oral BID BM  . fentaNYL  1 patch Transdermal Q72H  . multivitamin with minerals  1 tablet Oral Daily  . phosphorus  500 mg Oral TID  . polyethylene glycol  17 g Oral BID  . senna  2 tablet Oral BID    Continuous Infusions: . sodium chloride 75 mL/hr at  02/07/21 0630  . azithromycin 500 mg (02/08/21 1658)  . cefTRIAXone (ROCEPHIN)  IV 1 g (02/08/21 1834)    PRN Meds: acetaminophen **OR** acetaminophen, ALPRAZolam, alum & mag hydroxide-simeth, morphine injection, ondansetron **OR** ondansetron (ZOFRAN) IV, oxyCODONE  Physical Exam         General: Alert, awake, in moderate distress.   HEENT: No bruits, no goiter, no JVD Heart: Regular rate and rhythm. No murmur appreciated. Lungs: Good air movement, clear Abdomen: Soft, nontender, distended, positive  bowel sounds.   Ext: No significant edema Skin: Warm and dry Neuro: Grossly intact, nonfocal.   Vital Signs: BP 116/77   Pulse (!) 118   Temp 98.8 F (37.1 C) (Oral)   Resp 18   SpO2 99%  SpO2: SpO2: 99 % O2 Device: O2 Device: Room Air O2 Flow Rate:    Intake/output summary:  Intake/Output Summary (Last 24 hours) at 02/09/2021 0910 Last data filed at 02/09/2021 0857 Gross per 24 hour  Intake 3855.54 ml  Output 1000 ml  Net 2855.54 ml   LBM: Last BM Date: 02/08/21 Baseline Weight:   Most recent weight:         Palliative Assessment/Data:    Flowsheet Rows    Flowsheet Row Most Recent Value  Intake Tab   Referral Department Hospitalist  Unit at Time of Referral Oncology Unit  Palliative Care Primary Diagnosis Cancer  Date Notified 02/06/21  Palliative Care Type New Palliative care  Reason for referral Clarify Goals of Care, Pain  Date of Admission 02/05/21  Date first seen by Palliative Care 02/07/21  # of days Palliative referral response time 1 Day(s)  # of days IP prior to Palliative referral 1  Clinical Assessment   Palliative Performance Scale Score 40%  Psychosocial & Spiritual Assessment   Palliative Care Outcomes   Patient/Family meeting held? Yes  Who was at the meeting? Patient  Palliative Care Outcomes Clarified goals of care, Improved pain interventions       Patient Active Problem List   Diagnosis Date Noted  . Metastatic cancer ([place])  02/05/2021  . Cancer related pain 02/05/2021  . Constipation 02/05/2021  . HIV (human immunodeficiency virus infection) ([place]) 02/05/2021  . Metastatic breast cancer ([place]) 01/23/2021  . Metastatic malignant neoplasm ([place]) 01/21/2021  . Palliative care patient 01/21/2021  . Malnutrition of moderate degree 01/16/2021    Palliative Care Assessment & Plan   Patient Profile:  50 y.o. 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    Recommendations/Plan: Full code/full scope She had friends visiting from [place] and per her/their request we had another meeting today to review her hope of having biopsy done and discussing systemic therapies with Dr. Lindi Adie.   Pain: Cancer-related.  We also discussed pain management in detail today.  Continue fentanyl 50 mcg/h patch with oxycodone 10 mg every 4 hours as needed for breakthrough pain.  She also has IV medication available for acute worsening of pain. Constipation, opioid related: Reporting increased pelvic pain today.  X-ray is pending.  Continue MiraLAX and senna.  Recommended consideration for enema/suppository but she would like to hold off on this today as she thinks that things are starting to move.  Goals of Care and Additional Recommendations: Limitations on Scope of Treatment: Full Scope Treatment  Code Status:    Code Status Orders  (From admission, onward)           Start     Ordered   02/05/21 1835  Full code  Continuous        02/05/21 1834           Code Status History     Date Active Date Inactive Code Status Order ID Comments User Context   01/14/2021 1822 01/21/2021 1842 Full Code [id number]  Roosevelt [place],  Ralene Cork, MD ED      Advance Directive Documentation    Flowsheet Row Most  Recent Value  Type of Advance Directive Healthcare Power of Attorney, Living will  Pre-existing out of facility DNR order (yellow form or pink MOST form) --  "MOST" Form in Place? --       Prognosis:  Unable to determine  Discharge Planning: To Be Determined  Care plan was discussed with patient, friends at bedside  Thank you for allowing the Palliative Medicine Team to assist in the care of this patient.   Total Time 45 Prolonged Time Billed No      Greater than 50%  of this time was spent counseling and coordinating care related to the above assessment and plan.  Micheline Rough, MD  Please contact Palliative Medicine Team phone at [phone] for questions and concerns.

## 2021-02-10 DIAGNOSIS — T402X5A Adverse effect of other opioids, initial encounter: Secondary | ICD-10-CM

## 2021-02-10 DIAGNOSIS — K5903 Drug induced constipation: Secondary | ICD-10-CM

## 2021-02-10 MED ORDER — FLUCONAZOLE 100 MG PO TABS
100.0000 mg | ORAL_TABLET | Freq: Every day | ORAL | Status: DC
  Administered 2021-02-10 – 2021-02-11 (×2): 100 mg via ORAL
  Filled 2021-02-10 (×2): qty 1

## 2021-02-10 MED ORDER — ENOXAPARIN SODIUM 40 MG/0.4ML IJ SOSY: 40.0000 mg | PREFILLED_SYRINGE | INTRAMUSCULAR | Status: DC

## 2021-02-10 MED ORDER — NYSTATIN 100000 UNIT/GM EX POWD
Freq: Three times a day (TID) | CUTANEOUS | Status: DC
  Filled 2021-02-10: qty 15

## 2021-02-10 NOTE — Progress Notes (Signed)
Daily Progress Note   Patient Name: Ashley Ortega       Date: 02/10/2021 DOB: Oct 12, 1970  Age: 50 y.o. MRN#: [id number] Attending Physician: Hosie Poisson, MD Primary Care Physician: Pcp, No Admit Date: 02/05/2021  Reason for Consultation/Follow-up: Establishing goals of care  Subjective: I saw and examined Ashley Ortega today.  She was sitting in bedside chair.  She appears to be weaker and reports she is not feeling as well today.  She denies having pain at this time.  Tells me she discussed some medication and she is feeling a little "foggy."  States she has some questions about plan moving forward but she cannot think of that at this time.  I asked her to write them down so we can discuss further tomorrow.  Length of Stay: 4  Current Medications: Scheduled Meds:  . bictegravir-emtricitabine-tenofovir AF  1 tablet Oral Daily  . [START ON 02/11/2021] enoxaparin (LOVENOX) injection  40 mg Subcutaneous Q24H  . feeding supplement  237 mL Oral BID BM  . fentaNYL  1 patch Transdermal Q72H  . fluconazole  100 mg Oral Daily  . multivitamin with minerals  1 tablet Oral Daily  . nystatin   Topical TID  . phosphorus  500 mg Oral TID  . polyethylene glycol  17 g Oral BID  . senna  2 tablet Oral BID    Continuous Infusions: . sodium chloride 1,000 mL (02/10/21 1132)  . azithromycin 500 mg (02/10/21 1224)  . cefTRIAXone (ROCEPHIN)  IV 1 g (02/10/21 1138)    PRN Meds: acetaminophen **OR** acetaminophen, ALPRAZolam, alum & mag hydroxide-simeth, morphine injection, ondansetron **OR** ondansetron (ZOFRAN) IV, oxyCODONE  Physical Exam         General: Alert, awake, in moderate distress.   HEENT: No bruits, no goiter, no JVD Heart: Regular rate and rhythm. No murmur appreciated. Lungs: Good  air movement, clear Abdomen: Soft, nontender, distended, positive bowel sounds.   Ext: No significant edema Skin: Warm and dry Neuro: Grossly intact, nonfocal.   Vital Signs: BP (!) 141/90 (BP Location: Left Arm)   Pulse (!) 130   Temp 99.2 F (37.3 C) (Oral)   Resp 16   SpO2 97%  SpO2: SpO2: 97 % O2 Device: O2 Device: Room Air O2 Flow Rate:    Intake/output summary:  Intake/Output Summary (Last 24  hours) at 02/10/2021 1755 Last data filed at 02/10/2021 1400 Gross per 24 hour  Intake 1664.82 ml  Output 100 ml  Net 1564.82 ml    LBM: Last BM Date: 02/09/21 Baseline Weight:   Most recent weight:         Palliative Assessment/Data:    Flowsheet Rows    Flowsheet Row Most Recent Value  Intake Tab   Referral Department Hospitalist  Unit at Time of Referral Oncology Unit  Palliative Care Primary Diagnosis Cancer  Date Notified 02/06/21  Palliative Care Type New Palliative care  Reason for referral Clarify Goals of Care, Pain  Date of Admission 02/05/21  Date first seen by Palliative Care 02/07/21  # of days Palliative referral response time 1 Day(s)  # of days IP prior to Palliative referral 1  Clinical Assessment   Palliative Performance Scale Score 40%  Psychosocial & Spiritual Assessment   Palliative Care Outcomes   Patient/Family meeting held? Yes  Who was at the meeting? Patient  Palliative Care Outcomes Clarified goals of care, Improved pain interventions       Patient Active Problem List   Diagnosis Date Noted  . Metastatic cancer ([place]) 02/05/2021  . Cancer related pain 02/05/2021  . Constipation 02/05/2021  . HIV (human immunodeficiency virus infection) ([place]) 02/05/2021  . Metastatic breast cancer ([place]) 01/23/2021  . Metastatic malignant neoplasm ([place]) 01/21/2021  . Palliative care patient 01/21/2021  . Malnutrition of moderate degree 01/16/2021    Palliative Care Assessment & Plan   Patient Profile:  50 y.o. 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    Recommendations/Plan: Full code/full scope Pain: Cancer-related.  We also discussed pain management in detail today.  Continue fentanyl 50 mcg/h patch with oxycodone 10 mg every 4 hours as needed for breakthrough pain.  She also has IV medication available for acute worsening of pain.  She has been requiring around-the-clock rescue medication.  We should probably consider titrating her fentanyl patch but she declined to do so today. Constipation, opioid related: Continue MiraLAX and senna.  Recommended consideration for enema/suppository but she refused.  Goals of Care and Additional Recommendations: Limitations on Scope of Treatment: Full Scope Treatment  Code Status:    Code Status Orders  (From admission, onward)           Start     Ordered   02/05/21 1835  Full code  Continuous        02/05/21 1834           Code Status History     Date Active Date Inactive Code Status Order ID Comments User Context   01/14/2021 1822 01/21/2021 1842 Full Code [id number]  Lequita Halt, MD ED      Advance Directive Documentation    Flowsheet Row Most Recent Value  Type of Advance Directive Healthcare Power of Attorney, Living will  Pre-existing out of facility DNR order (yellow form or pink MOST form) --  "MOST" Form in Place? --       Prognosis:  Unable to determine  Discharge Planning: To Be Determined  Care plan was discussed with patient, friends at bedside  Thank you for allowing the Palliative Medicine Team to assist in the care of this patient.   Total Time 30 Prolonged  Time Billed No   Greater than 50%  of this time was spent counseling and coordinating care related to the above assessment and plan.   Micheline Rough, MD  Please contact Palliative Medicine Team phone at [phone] for questions and concerns.

## 2021-02-10 NOTE — Progress Notes (Signed)
PROGRESS NOTE    Ashley Ortega  [id number] DOB: 1971-05-04 DOA: 02/05/2021 PCP: Pcp, No (   Chief Complaint  Patient presents with   Shortness of Breath    Brief Narrative:  Ashley Ortega is a 50 y.o. female with medical history significant of metastatic breast cancer, incomplete chemotherapies in the past, metastasis to multiple bones/ vertebrae and liver who was here today for liver biopsy, found to have too much pain and discomfort so sent to emergency room.  Patient was in the ED for similar complaints recently and was discharged home on pain medications.  Patient was diagnosed with metastatic breast cancer in 2020.  She received 1 dose of IV chemotherapy and was not able to tolerate any further chemo's.  She was also recently found to have metastatic lesions in the liver.  And was scheduled for liver biopsy.  Patient was deemed not medically stable for procedure due to persistent pain and she was sent to ED for evaluation of pain control and dehydration. Patient seen and examined this morning she reports she has not gotten out of bed and does not feel too good.  Assessment & Plan:   Principal Problem:   Cancer related pain Active Problems:   Metastatic malignant neoplasm ([place])   Metastatic cancer ([facility])   Constipation   HIV (human immunodeficiency virus infection) ([place])   Stage IV metastatic breast cancer with mets to the liver and bone coming with severe pain, failure to thrive, severe protein calorie malnutrition. -Aggressive pain control with fentanyl patch 50 MCG, IV morphine every 2 hours as needed and restarted her home dose of oxycodone 10 mg every 6 hours as needed. Pain is well controlled Aggressive laxative regimen added by palliative care. Palliative care consulted by admitting physician for goals of care discussion and symptom management.  Patient would like to get liver biopsy done to see if she would be a candidate for chemotherapy.   Constipation:  Added  laxatives.  Patient is refusing any suppositories at this time   Anxiety:  Added prn xanax.     Anemia of chronic disease Baseline hemoglobin around 8.  Hemoglobin stable around 8.8 Hemoglobin on admission probably hemoconcentrated sample due to dehydration.   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  Protein calorie malnutrition:  Dietary consulted .     Failure to thrive.  Probably secondary to  liver mets Palliative care consulted for goals of care discussion.   Fever Unclear etiology however she has some left  lower lobe atelectasis vs infiltrate on CT chest, will empirically treat for pneumonia. Urine analysis remains negative chest x-ray does not show any overt pneumonia, urine cultures and blood cultures sent and are pending. Pelvic x-rays show bone meta stasis. WBC count within normal limits,   Hypokalemia and hypophosphatemia Replaced.  Liver mets She is scheduled for biopsy by IR as outpatient.  But patient insisted having biopsy done while she is still in here.  Odynophagia Will check for strep throat    DVT prophylaxis: (Lovenox) Code Status: (Full code) Family Communication: ( none at bedside) Disposition:   Status is: Inpatient.   The patient will require care spanning > 2 midnights and should be moved to inpatient because: Unsafe d/c plan and IV treatments appropriate due to intensity of illness or inability to take PO  Dispo: The patient is from: Home              Anticipated d/c is to: Home  Patient currently is not medically stable to d/c.   Difficult to place patient No       Consultants:  Palliative care consult.  Oncology  Procedures: None.   Antimicrobials: none.    Subjective: She reports not feeling great has been in bed and reports that her feet are hurting, no chest pain or shortness of breath, nausea vomiting or abdominal pain at this time.  Objective: Vitals:   02/09/21 1823 02/09/21 2206 02/10/21  0200 02/10/21 0522  BP: 113/72 116/75 132/77 115/73  Pulse: (!) 122 (!) 118 (!) 110 (!) 102  Resp: '16 14 16 18  '$ Temp: 99.3 F (37.4 C) 98.9 F (37.2 C) 98.5 F (36.9 C) 98.8 F (37.1 C)  TempSrc: Oral Oral Oral Oral  SpO2: 99% 100% 95% 99%    Intake/Output Summary (Last 24 hours) at 02/10/2021 1246 Last data filed at 02/10/2021 [id number] Gross per 24 hour  Intake 2288.45 ml  Output 100 ml  Net 2188.45 ml    There were no vitals filed for this visit.  Examination:  General exam: Young lady, ill-appearing, appears uncomfortable but not in any kind of distress Respiratory system: Air entry fair bilateral no wheezing or rhonchi, patient is on room air Cardiovascular system: S1-S2 heard, tachycardic, no JVD or pedal edema Gastrointestinal system: Abdomen is soft nontender bowel sounds normal Central nervous system: Alert and oriented, grossly nonfocal Extremities: no pedal edema Skin: No rashes seen.  Psychiatry: Anxious    Data Reviewed: I have personally reviewed following labs and imaging studies  CBC: Recent Labs  Lab 02/05/21 1214 02/06/21 0518 02/08/21 0517 02/09/21 0603  WBC 8.0 5.2 6.8 7.3  NEUTROABS 6.5  --   --  5.5  HGB 11.2* 8.4* 9.5* 8.8*  HCT 34.5* 24.9* 29.2* 27.2*  MCV 85.8 84.1 85.9 85.0  PLT 505* 375 426* 267     Basic Metabolic Panel: Recent Labs  Lab 02/05/21 1214 02/06/21 0518 02/08/21 0517 02/08/21 1635 02/09/21 0603  NA 135 136 139  --  133*  K 3.5 3.7 3.1*  --  4.0  CL 99 105 103  --  99  CO2 '25 25 27  '$ --  27  GLUCOSE 108* 87 107*  --  92  BUN '11 7 7  '$ --  <5*  CREATININE 0.58 0.51 0.63  --  0.37*  CALCIUM 10.8* 9.7 10.0  --  9.7  MG  --  2.0  --  2.2  --   PHOS  --  2.5  --  1.9*  --      GFR: CrCl cannot be calculated (Unknown ideal weight.).  Liver Function Tests: Recent Labs  Lab 02/05/21 1214  AST 46*  ALT 17  ALKPHOS 203*  BILITOT 0.6  PROT 9.8*  ALBUMIN 3.4*     CBG: No results for input(s): GLUCAP in the  last 168 hours.   Recent Results (from the past 240 hour(s))  Resp Panel by RT-PCR (Flu A&B, Covid) Nasopharyngeal Swab     Status: None   Collection Time: 02/05/21 12:15 PM   Specimen: Nasopharyngeal Swab; Nasopharyngeal(NP) swabs in vial transport medium  Result Value Ref Range Status   SARS Coronavirus 2 by RT PCR NEGATIVE NEGATIVE Final    Comment: (NOTE) SARS-CoV-2 target nucleic acids are NOT DETECTED.  The SARS-CoV-2 RNA is generally detectable in upper respiratory specimens during the acute phase of infection. The lowest concentration of SARS-CoV-2 viral copies this assay can detect is 138 copies/mL. A negative result does  not preclude SARS-Cov-2 infection and should not be used as the sole basis for treatment or other patient management decisions. A negative result may occur with  improper specimen collection/handling, submission of specimen other than nasopharyngeal swab, presence of viral mutation(s) within the areas targeted by this assay, and inadequate number of viral copies(<138 copies/mL). A negative result must be combined with clinical observations, patient history, and epidemiological information. The expected result is Negative.  Fact Sheet for Patients:  EntrepreneurPulse.com.au  Fact Sheet for Healthcare Providers:  IncredibleEmployment.be  This test is no t yet approved or cleared by the [place] FDA and  has been authorized for detection and/or diagnosis of SARS-CoV-2 by FDA under an Emergency Use Authorization (EUA). This EUA will remain  in effect (meaning this test can be used) for the duration of the COVID-19 declaration under Section 564(b)(1) of the Act, 21 U.S.C.section 360bbb-3(b)(1), unless the authorization is terminated  or revoked sooner.       Influenza A by PCR NEGATIVE NEGATIVE Final   Influenza B by PCR NEGATIVE NEGATIVE Final    Comment: (NOTE) The Xpert Xpress SARS-CoV-2/FLU/RSV plus assay is  intended as an aid in the diagnosis of influenza from Nasopharyngeal swab specimens and should not be used as a sole basis for treatment. Nasal washings and aspirates are unacceptable for Xpert Xpress SARS-CoV-2/FLU/RSV testing.  Fact Sheet for Patients: EntrepreneurPulse.com.au  Fact Sheet for Healthcare Providers: IncredibleEmployment.be  This test is not yet approved or cleared by the [place] FDA and has been authorized for detection and/or diagnosis of SARS-CoV-2 by FDA under an Emergency Use Authorization (EUA). This EUA will remain in effect (meaning this test can be used) for the duration of the COVID-19 declaration under Section 564(b)(1) of the Act, 21 U.S.C. section 360bbb-3(b)(1), unless the authorization is terminated or revoked.  Performed at [facility], [place] [address], [place], [place] [address]   Culture, blood (Routine X 2) w Reflex to ID Panel     Status: None (Preliminary result)   Collection Time: 02/08/21  4:35 PM   Specimen: BLOOD  Result Value Ref Range Status   Specimen Description   Final    BLOOD LEFT ANTECUBITAL Performed at [facility] [address], [place], [place] [address]    Special Requests   Final    BOTTLES DRAWN AEROBIC AND ANAEROBIC Blood Culture adequate volume Performed at [facility] [address] [place], [place] [address]    Culture   Final    NO GROWTH < 24 HOURS Performed at [facility] [address] [place], [place] [address]    Report Status PENDING  Incomplete  Culture, blood (Routine X 2) w Reflex to ID Panel     Status: None (Preliminary result)   Collection Time: 02/08/21  4:35 PM   Specimen: BLOOD  Result Value Ref Range Status   Specimen Description   Final    BLOOD LEFT ANTECUBITAL Performed at [facility] [address] [place], [place] [address]    Special Requests   Final     BOTTLES DRAWN AEROBIC AND ANAEROBIC Blood Culture results may not be optimal due to an excessive volume of blood received in culture bottles Performed at [facility] [address] [place], [place] [address]    Culture   Final    NO GROWTH < 24 HOURS Performed at [facility] [address] [place], [place] [address]  Report Status PENDING  Incomplete          Radiology Studies: DG Chest 2 View  Result Date: 02/08/2021 CLINICAL DATA:  Fever EXAM: CHEST - 2 VIEW COMPARISON:  02/05/2021 FINDINGS: Possible tiny pleural effusions on lateral view. No focal opacity or pneumothorax. Normal cardiac size. IMPRESSION: Possible trace pleural effusions.  No focal airspace disease. Electronically Signed   By: Donavan Foil M.D.   On: 02/08/2021 19:42   DG HIPS BILAT WITH PELVIS 2V  Result Date: 02/08/2021 CLINICAL DATA:  Anterior pelvic pain for the past month. History of metastatic breast cancer. EXAM: DG HIP (WITH OR WITHOUT PELVIS) 2V BILAT COMPARISON:  CT abdomen pelvis dated December 23, 2020. FINDINGS: No acute fracture or dislocation. Lytic lesions involving the right lesser tuberosity, left ischial tuberosity, left inferior pubic ramus, and bilateral sacral ala, better evaluated on most recent CT. Hip joint spaces are preserved. Soft tissues are unremarkable. IMPRESSION: 1. No acute osseous abnormality. 2. Multifocal lytic lesions, consistent with known metastatic disease, better evaluated on most recent CT. Electronically Signed   By: Titus Dubin M.D.   On: 02/08/2021 19:44        Scheduled Meds:  bictegravir-emtricitabine-tenofovir AF  1 tablet Oral Daily   enoxaparin (LOVENOX) injection  40 mg Subcutaneous Q24H   feeding supplement  237 mL Oral BID BM   fentaNYL  1 patch Transdermal Q72H   multivitamin with minerals  1 tablet Oral Daily   phosphorus  500 mg Oral TID   polyethylene glycol  17 g Oral BID   senna  2 tablet Oral BID   Continuous  Infusions:  sodium chloride 1,000 mL (02/10/21 1132)   azithromycin 500 mg (02/10/21 1224)   cefTRIAXone (ROCEPHIN)  IV 1 g (02/10/21 1138)     LOS: 4 days       Hosie Poisson, MD [facility]   To contact the attending provider between 7A-7P or the covering provider during after hours 7P-7A, please log into the web site [email or web address] and access using universal [place] password for that web site. If you do not have the password, please call the hospital operator.  02/10/2021, 12:46 PM

## 2021-02-10 NOTE — Progress Notes (Signed)
   02/10/21 1341  Assess: MEWS Score  Temp (!) 100.4 F (38 C)  BP (!) 143/94  Pulse Rate (!) 133  Resp 17  SpO2 98 %  O2 Device Room Air  Assess: MEWS Score  MEWS Temp 0  MEWS Systolic 0  MEWS Pulse 3  MEWS RR 0  MEWS LOC 0  MEWS Score 3  MEWS Score Color Yellow  Assess: SIRS CRITERIA  SIRS Temperature  0  SIRS Pulse 1  SIRS Respirations  0  SIRS WBC 0  SIRS Score Sum  1

## 2021-02-10 NOTE — Progress Notes (Signed)
   02/10/21 1404  Assess: if the MEWS score is Yellow or Red  Were vital signs taken at a resting state? Yes  Focused Assessment Change from prior assessment (see assessment flowsheet)  Does the patient meet 2 or more of the SIRS criteria? No  Does the patient have a confirmed or suspected source of infection? Yes  Provider and Rapid Response Notified? Yes  MEWS guidelines implemented *See Row Information* Yes  Notify: Charge Nurse/RN  Name of Charge Nurse/RN Notified Luanne Bras  Date Charge Nurse/RN Notified 02/10/21  Time Charge Nurse/RN Notified 1406  Notify: Provider  Provider Name/Title Dr Karleen Hampshire  Date Provider Notified 02/10/21  Time Provider Notified 1407  Notification Type Page  Notification Reason Change in status  Provider response No new orders  Date of Provider Response 02/10/21  Time of Provider Response 1428

## 2021-02-10 NOTE — Progress Notes (Signed)
   02/10/21 1610  Assess: MEWS Score  Temp 99.5 F (37.5 C)  BP 130/86  Pulse Rate (!) 140  Resp 16  SpO2 97 %  O2 Device Room Air  Assess: MEWS Score  MEWS Temp 0  MEWS Systolic 0  MEWS Pulse 3  MEWS RR 0  MEWS LOC 0  MEWS Score 3  MEWS Score Color Yellow  Assess: SIRS CRITERIA  SIRS Temperature  0  SIRS Pulse 1  SIRS Respirations  0  SIRS WBC 0  SIRS Score Sum  1

## 2021-02-11 ENCOUNTER — Other Ambulatory Visit (HOSPITAL_COMMUNITY): Payer: PRIVATE HEALTH INSURANCE

## 2021-02-11 DIAGNOSIS — G893 Neoplasm related pain (acute) (chronic): Secondary | ICD-10-CM | POA: Diagnosis not present

## 2021-02-11 LAB — BASIC METABOLIC PANEL
Anion gap: 9 (ref 5–15)
BUN: 6 mg/dL (ref 6–20)
CO2: 27 mmol/L (ref 22–32)
Calcium: 9.3 mg/dL (ref 8.9–10.3)
Chloride: 100 mmol/L (ref 98–111)
Creatinine, Ser: 0.49 mg/dL (ref 0.44–1.00)
GFR, Estimated: >60 mL/min (ref >60)
Glucose, Bld: 120 mg/dL — ABNORMAL HIGH (ref 70–99)
Potassium: 3.6 mmol/L (ref 3.5–5.1)
Sodium: 136 mmol/L (ref 135–145)

## 2021-02-11 LAB — PROTIME-INR
INR: 1.1 (ref 0.8–1.2)
Prothrombin Time: 14.6 seconds (ref 11.4–15.2)

## 2021-02-11 LAB — PHOSPHORUS: Phosphorus: 3.1 mg/dL (ref 2.5–4.6)

## 2021-02-11 LAB — LACTIC ACID, PLASMA: Lactic Acid, Venous: 1.2 mmol/L (ref 0.5–1.9)

## 2021-02-11 LAB — PROCALCITONIN: Procalcitonin: 0.8 ng/mL

## 2021-02-11 MED ORDER — METOPROLOL TARTRATE 5 MG/5ML IV SOLN
5.0000 mg | Freq: Three times a day (TID) | INTRAVENOUS | Status: DC | PRN
  Administered 2021-02-11 – 2021-02-12 (×2): 5 mg via INTRAVENOUS
  Filled 2021-02-11: qty 5

## 2021-02-11 MED ORDER — METOPROLOL TARTRATE 25 MG PO TABS
25.0000 mg | ORAL_TABLET | Freq: Two times a day (BID) | ORAL | Status: DC
  Administered 2021-02-11 – 2021-02-15 (×8): 25 mg via ORAL
  Filled 2021-02-11 (×10): qty 1

## 2021-02-11 MED ORDER — METOPROLOL TARTRATE 5 MG/5ML IV SOLN: 5.0000 mg | Freq: Three times a day (TID) | INTRAVENOUS | Status: DC | PRN

## 2021-02-11 MED ORDER — ENSURE ENLIVE PO LIQD
237.0000 mL | Freq: Three times a day (TID) | ORAL | Status: DC
  Administered 2021-02-13 – 2021-02-14 (×2): 237 mL via ORAL

## 2021-02-11 MED ORDER — METOPROLOL TARTRATE 5 MG/5ML IV SOLN
INTRAVENOUS | Status: AC
  Administered 2021-02-11: 5 mg via INTRAVENOUS
  Filled 2021-02-11: qty 5

## 2021-02-11 NOTE — Progress Notes (Addendum)
HEMATOLOGY-ONCOLOGY PROGRESS NOTE  SUBJECTIVE: Ashley Ortega is seen for follow-up today.  She is sitting up in the recliner.  She is fairly somnolent secondary to pain medication.  She tells me that she is having mid to low back pain.  She has difficulty staying awake to talk to me.  Chart reviewed.  She continues to have fevers.  She is tachycardic with heart rate in the 130s to 140s.  A liver biopsy has been ordered by IR tentatively to be done later today.  Oncology History  Metastatic breast cancer ([place])  10/2018 Initial Diagnosis   Locally advanced right breast cancer triple negative, status post 2 cycles of neoadjuvant dose dense Adriamycin and Cytoxan, lost to follow-up (holistic therapy), initially followed by Dr. Cecil Cranker at [facility] cancer care   09/04/2020 Relapse/Recurrence   Bilateral breast cancers with diffuse osseous metastases Left breast: Grade 2 IDC ER 5%, PR less than 1%, HER2 negative, Ki-67 40% Right breast: Grade 2 IDC, ER less than 1%, PR less than 1%, HER2 negative, Ki-67 70% Left axilla lymph node biopsy: Metastatic carcinoma 12/13/2020: MRI thoracic spine: Innumerable bone lesions Followed by Dr. Merrie Roof    09/25/2020 -  Chemotherapy   Palliative chemotherapy with Taxol discontinued after 1 cycle       REVIEW OF SYSTEMS:   Unable to obtain a comprehensive review of systems.  I have reviewed the past medical history, past surgical history, social history and family history with the patient and they are unchanged from previous note.   PHYSICAL EXAMINATION: ECOG PERFORMANCE STATUS: 2 - Symptomatic, <50% confined to bed  Vitals:   02/11/21 0656 02/11/21 1116  BP: (!) 133/101 (!) 159/103  Pulse: (!) 131 (!) 145  Resp: 18 18  Temp: 99.1 F (37.3 C) (!) 102.2 F (39 C)  SpO2: 98% 96%   There were no vitals filed for this visit.  Intake/Output from previous day: 09/11 0701 - 09/12 0700 In: 1161.3 [P.O.:240; I.V.:571.3; IV Piggyback:350] Out: 2850  [Urine:2850]  GENERAL: Chronically ill-appearing female, somnolent SKIN: skin color, texture, turgor are normal, no rashes or significant lesions EYES: normal, Conjunctiva are pink and non-injected, sclera clear OROPHARYNX:no exudate, no erythema and lips, buccal mucosa, and tongue normal  LUNGS: clear to auscultation and percussion with normal breathing effort HEART: regular rate & rhythm and no murmurs and no lower extremity edema ABDOMEN:abdomen soft, non-tender and normal bowel sounds NEURO: alert & oriented x 3 with fluent speech, no focal motor/sensory deficits  LABORATORY DATA:  I have reviewed the data as listed CMP Latest Ref Rng & Units 02/09/2021 02/08/2021 02/06/2021  Glucose 70 - 99 mg/dL 92 107(H) 87  BUN 6 - 20 mg/dL <5(L) 7 7  Creatinine 0.44 - 1.00 mg/dL 0.37(L) 0.63 0.51  Sodium 135 - 145 mmol/L 133(L) 139 136  Potassium 3.5 - 5.1 mmol/L 4.0 3.1(L) 3.7  Chloride 98 - 111 mmol/L 99 103 105  CO2 22 - 32 mmol/L _0 Calcium 8.9 - 10.3 mg/dL 9.7 10.0 9.7  Total Protein 6.5 - 8.1 g/dL - - -  Total Bilirubin 0.3 - 1.2 mg/dL - - -  Alkaline Phos 38 - 126 U/L - - -  AST 15 - 41 U/L - - -  ALT 0 - 44 U/L - - -    Lab Results  Component Value Date   WBC 7.3 02/09/2021   HGB 8.8 (L) 02/09/2021   HCT 27.2 (L) 02/09/2021   MCV 85.0 02/09/2021   PLT 267  02/09/2021   NEUTROABS 5.5 02/09/2021    DG Chest 2 View  Result Date: 02/08/2021 CLINICAL DATA:  Fever EXAM: CHEST - 2 VIEW COMPARISON:  02/05/2021 FINDINGS: Possible tiny pleural effusions on lateral view. No focal opacity or pneumothorax. Normal cardiac size. IMPRESSION: Possible trace pleural effusions.  No focal airspace disease. Electronically Signed   By: Donavan Foil M.D.   On: 02/08/2021 19:42   DG Chest 2 View  Result Date: 02/05/2021 CLINICAL DATA:  Shortness of breath EXAM: CHEST - 2 VIEW COMPARISON:  01/14/2021 FINDINGS: No new consolidation or edema. No pleural effusion or pneumothorax. Stable  cardiomediastinal contours with normal heart size. No acute osseous abnormality. IMPRESSION: No acute process in the chest. Electronically Signed   By: Macy Mis M.D.   On: 02/05/2021 12:29   DG Chest 2 View  Result Date: 01/14/2021 CLINICAL DATA:  Shortness of breath EXAM: CHEST - 2 VIEW COMPARISON:  CT chest dated December 23, 2020 FINDINGS: Cardiac and mediastinal contours unchanged and within normal limits. Bibasilar atelectasis. Lungs otherwise clear. No evidence of pleural effusion or pneumothorax. Scattered lytic osseous lesions mild deviation of the left paraspinal line, compatible with known osseous metastatic disease. IMPRESSION: No acute cardiopulmonary disease. Electronically Signed   By: Yetta Glassman M.D.   On: 01/14/2021 12:13   CT ANGIO CHEST PE W OR WO CONTRAST  Result Date: 02/05/2021 CLINICAL DATA:  50 year old female with history of shortness of breath since this morning. History of metastatic breast cancer. Evaluate for pulmonary embolism. EXAM: CT ANGIOGRAPHY CHEST WITH CONTRAST TECHNIQUE: Multidetector CT imaging of the chest was performed using the standard protocol during bolus administration of intravenous contrast. Multiplanar CT image reconstructions and MIPs were obtained to evaluate the vascular anatomy. CONTRAST:  72m OMNIPAQUE IOHEXOL 350 MG/ML SOLN COMPARISON:  Chest CTA 01/14/2021. FINDINGS: Cardiovascular: No filling defects in the pulmonary arterial tree to suggest pulmonary embolism. Heart size is normal. There is no significant pericardial fluid, thickening or pericardial calcification. No atherosclerotic calcifications in the thoracic aorta or the coronary arteries. Mediastinum/Nodes: No pathologically enlarged mediastinal, hilar or internal mammary lymph nodes. Esophagus is unremarkable in appearance. No axillary lymphadenopathy. Lungs/Pleura: New area of subsegmental atelectasis in the left lower lobe. No acute consolidative airspace disease. No pleural effusions.  No suspicious appearing pulmonary nodules or masses are noted. Upper Abdomen: Unremarkable. Musculoskeletal: Widespread lytic lesions are again noted throughout the visualized axial and appendicular skeleton, indicative of widespread metastatic disease to the bones. These appear relatively similar to the prior study, although there is increasing pathologic compression related to the lesion in the L1 vertebral body where there is up to 70% loss of height in the lateral aspect of the vertebral body on the right side where there is a large infiltrative mass extending into the transverse process and narrowing the spinal canal, measuring up to 6.4 x 3.8 cm (axial image 119 of series 5). There is also been some further compression at T12, now with up to 50% loss of anterior vertebral body height. Review of the MIP images confirms the above findings. IMPRESSION: 1. No evidence of pulmonary embolism. 2. Widespread metastatic disease to the bones, with increasing pathologic compression fractures at T12 and L1, as above. 3. Small amount of subsegmental atelectasis in the left lower lobe is new compared to the prior study. Electronically Signed   By: DVinnie LangtonM.D.   On: 02/05/2021 15:00   CT Angio Chest PE W/Cm &/Or Wo Cm  Result Date: 01/14/2021 CLINICAL DATA:  Metastatic breast cancer with recent progression. Shortness of breath for 2 days. Chest pain starting last night. EXAM: CT ANGIOGRAPHY CHEST WITH CONTRAST TECHNIQUE: Multidetector CT imaging of the chest was performed using the standard protocol during bolus administration of intravenous contrast. Multiplanar CT image reconstructions and MIPs were obtained to evaluate the vascular anatomy. CONTRAST:  12m OMNIPAQUE IOHEXOL 350 MG/ML SOLN COMPARISON:  12/23/2020 FINDINGS: Cardiovascular: No filling defect is identified in the pulmonary arterial tree to suggest pulmonary embolus. Mild aortic arch atherosclerotic calcification. Mediastinum/Nodes: Right  infrahilar node 0.7 cm in short axis on image 69 series 6, stable. Similar sized left infrahilar lymph node. Scattered small left axillary lymph nodes. Lungs/Pleura: Increased subsegmental atelectasis in the posterior basal segment of the right lower lobe. Mild scarring or atelectasis medially in the left lower lobe. Mild lingular scarring. Upper Abdomen: The hepatic metastatic lesions seen on 12/23/2020 are poorly appreciated on today's CTA chest, possibly related to the phase of contrast. A dominant lesion posteriorly in the dome of the right hepatic lobe is only very faintly appreciable on image 97 series 6. Musculoskeletal: Rapidly progressive widespread lytic metastatic disease throughout the chest including multiple lytic rib lesions which have worsened in addition to numerous lesions in the thoracic spine which have enlarged from 12/23/2020. The largest spinal lesion continues to be the large geographic vertebral lesion at L1 with right posterior element involvement and suspected epidural tumor involvement. An index lesion at T10 measures 1.9 by 1.5 cm on image 72 series 10, previously 1.2 by 1.2 cm. Cutaneous thickening in both breasts is stable. Review of the MIP images confirms the above findings. IMPRESSION: 1. No filling defect is identified in the pulmonary arterial tree to suggest pulmonary embolus. 2. Further progression of lytic metastatic lesions with lytic destructive rib lesions and vertebral lesions. Of the various scattered lytic spine lesions, the largest is at the L1-2 level with the mass involving the vertebral body and right posterior elements and potentially substantially extending into the spinal canal/epidural space. 3. The hepatic metastatic lesions are poorly seen on today's CTA chest, possibly related to the phase of contrast. 4.  Aortic Atherosclerosis (ICD10-I70.0). Electronically Signed   By: WVan ClinesM.D.   On: 01/14/2021 17:05   DG ABD ACUTE 2+V W 1V CHEST  Result  Date: 01/18/2021 CLINICAL DATA:  Abdominal distension, abdominal pain. Breast cancer. EXAM: DG ABDOMEN ACUTE WITH 1 VIEW CHEST COMPARISON:  12/23/2020, 01/14/2021 FINDINGS: There is no evidence of dilated bowel loops or free intraperitoneal air. No radiopaque calculi or other significant radiographic abnormality is seen. Heart size and mediastinal contours are within normal limits. Both lungs are clear. Numerous lytic lesions throughout the axial and appendicular skeleton including large lesions involving the L1 and L4 vertebral bodies. IMPRESSION: 1. Negative abdominal radiographs. No acute cardiopulmonary disease. 2. Widespread osseous metastatic disease, as seen on recent CT examinations. Electronically Signed   By: NDavina PokeD.O.   On: 01/18/2021 13:43   DG HIPS BILAT WITH PELVIS 2V  Result Date: 02/08/2021 CLINICAL DATA:  Anterior pelvic pain for the past month. History of metastatic breast cancer. EXAM: DG HIP (WITH OR WITHOUT PELVIS) 2V BILAT COMPARISON:  CT abdomen pelvis dated December 23, 2020. FINDINGS: No acute fracture or dislocation. Lytic lesions involving the right lesser tuberosity, left ischial tuberosity, left inferior pubic ramus, and bilateral sacral ala, better evaluated on most recent CT. Hip joint spaces are preserved. Soft tissues are unremarkable. IMPRESSION: 1. No acute osseous abnormality. 2. Multifocal lytic lesions,  consistent with known metastatic disease, better evaluated on most recent CT. Electronically Signed   By: Titus Dubin M.D.   On: 02/08/2021 19:44    ASSESSMENT AND PLAN: 1. Metastatic breast cancer: Bone metastases and liver metastases CT abdomen and pelvis 12/23/2020: Progressive metastatic disease widespread in the skeleton and new lesions in the liver CT chest 01/14/2021: Progression of lytic metastatic lesions in the rib and vertebral, hepatic metastasis  Plan: 1.  Appreciate assistance from palliative care.  I have reviewed their note to the patient has  indicated that she wants to continue aggressive measures.  We have previously discussed that she may have less than 6 months life expectancy and that hospice care is not unreasonable.  Furthermore, she has indicated that she does not want chemotherapy so unless her liver biopsy shows ER/PR positive disease, she may not have treatment options. 2.  Proceed with ultrasound-guided liver biopsy today if medically stable. 3.  Pain management per palliative care. 4.  Infectious work-up negative so far.  Fevers likely due to underlying malignancy.   LOS: 5 days   Mikey Bussing, DNP, AGPCNP-BC, AOCNP 02/11/21  Attending Note  I personally saw the patient, reviewed the chart and examined the patient. The plan of care was discussed with the patient and the admitting team. I agree with the assessment and plan as documented above. Thank you very much for the consultation. Metastatic breast cancer: After going back and forth on the issue of obtaining a tissue biopsy, she has ultimately decided to accept hospice and to hold off on doing any further biopsies. I think that is very rational approach considering the fact that she cannot tolerate any major systemic treatment options.

## 2021-02-11 NOTE — Progress Notes (Signed)
   02/11/21 1125  Mobility  Activity Contraindicated/medical hold   RN stated pt not doing well. Hold mobility for today.   [facility] Specialist Acute Rehab Services Office: [phone]

## 2021-02-11 NOTE — Progress Notes (Signed)
I informed Ashley Ortega, RRN and [name], NP that pt is a red mews. Pt was given Morpine, Tylenol, and the room was cooled.

## 2021-02-11 NOTE — Progress Notes (Addendum)
Pt's HR 160's notified by telemetry. MD aware as well- at nurses station. Metoprolol ordered.  Pt on BSC attempting to have a BM.    1030 HR 142  Pt requesting prn Xanax for anxiety. MD Karleen Hampshire to place orders for maintenance metoprolol for HR. RN to monitor HR after Xanax.  1116 HR 140's sustaining. Temp 102.2 BP 159/103  MD Karleen Hampshire aware, charge RN aware.  Prm Tylenol given, 25 mg PO metoprolol given.   1230 HR 111 on tele box

## 2021-02-11 NOTE — Progress Notes (Signed)
Pt continuing Red MEWS. Bloodwork ordered. MD Karleen Hampshire aware of VS. Daily metoprolol ordered for increased HR   02/11/21 1116  Assess: MEWS Score  Temp (!) 102.2 F (39 C)  BP (!) 159/103  Pulse Rate (!) 145  Resp 18  SpO2 96 %  Assess: MEWS Score  MEWS Temp 2  MEWS Systolic 0  MEWS Pulse 3  MEWS RR 0  MEWS LOC 0  MEWS Score 5  MEWS Score Color Red  Assess: if the MEWS score is Yellow or Red  Were vital signs taken at a resting state? Yes  Focused Assessment Change from prior assessment (see assessment flowsheet)  Does the patient meet 2 or more of the SIRS criteria? Yes  Does the patient have a confirmed or suspected source of infection? Yes  Provider and Rapid Response Notified? Yes  MEWS guidelines implemented *See Row Information* No, previously red, continue vital signs every 4 hours  Treat  MEWS Interventions Other (Comment) (MD ordered stat labs)  Pain Scale 0-10  Pain Score 10  Notify: Charge Nurse/RN  Name of Charge Nurse/RN Notified Melissa RN  Date Charge Nurse/RN Notified 02/11/21  Notify: Provider  Provider Name/Title Dr Karleen Hampshire  Date Provider Notified 02/11/21  Notification Type Face-to-face  Notification Reason Critical result  Provider response See new orders  Assess: SIRS CRITERIA  SIRS Temperature  1  SIRS Pulse 1  SIRS Respirations  0  SIRS WBC 0  SIRS Score Sum  2

## 2021-02-11 NOTE — Progress Notes (Signed)
Reviewed chart for red MEWS score. Medications for pain and fever administered by bedside RN. Advised decreasing room temp, applying ice packs to further lower temperature. Bedside RN denies any other acute distress at this time and is notifying on call medical provider. Rapid RN available to assist for any worsening of condition.

## 2021-02-11 NOTE — Plan of Care (Signed)
  Problem: Clinical Measurements: Goal: Diagnostic test results will improve Outcome: Progressing   

## 2021-02-11 NOTE — Progress Notes (Signed)
PROGRESS NOTE    Ashley Ortega  [id number] DOB: [birth year] DOA: 02/05/2021 PCP: Pcp, No (   Chief Complaint  Patient presents with   Shortness of Breath    Brief Narrative:  Ashley Ortega is a 50 y.o. female with medical history significant of metastatic breast cancer, incomplete chemotherapies in the past, metastasis to multiple bones/ vertebrae and liver who was here today for liver biopsy, found to have too much pain and discomfort so sent to emergency room.  Patient was in the ED for similar complaints recently and was discharged home on pain medications.  Patient was diagnosed with metastatic breast cancer in 2020.  She received 1 dose of IV chemotherapy and was not able to tolerate any further chemo's.  She was also recently found to have metastatic lesions in the liver.  And was scheduled for liver biopsy.  Patient was deemed not medically stable for procedure due to persistent pain and she was sent to ED for evaluation of pain control and dehydration. Patient seen and examined this morning , she reports feeling miserable.    Assessment & Plan:   Principal Problem:   Cancer related pain Active Problems:   Metastatic malignant neoplasm ([place])   Metastatic cancer (HCC)   Constipation   HIV (human immunodeficiency virus infection) ([place])   Stage IV metastatic breast cancer with mets to the liver and bone coming with severe pain, failure to thrive, severe protein calorie malnutrition. -Aggressive pain control with fentanyl patch 50 MCG, IV morphine every 2 hours as needed and restarted her home dose of oxycodone 10 mg every 6 hours as needed.suboptimally controlled.  Aggressive laxative regimen added by palliative care. Sister at bedside, and after further discussions, pt didn't want to undergo biopsy. They do not want any aggressive measures or procedures. They would like to transition to hospice.  Family is still deciding about home vs facility on discharge.  Poor  prognosis.    Constipation:  Added laxatives.  Patient is refusing any suppositories at this time   Anxiety:  Added prn xanax.     Anemia of chronic disease Baseline hemoglobin around 8.  Hemoglobin stable around 8.8 Hemoglobin on admission probably hemoconcentrated sample due to dehydration.   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  Protein calorie malnutrition:  Dietary consulted .   Failure to thrive.  Probably secondary to  liver mets Palliative care consulted for goals of care discussion.   Fever Unclear etiology however she has some left  lower lobe atelectasis vs infiltrate on CT chest, will empirically treat for pneumonia. Suspect this is malignancy related fevers.  Urine analysis remains negative chest x-ray does not show any overt pneumonia, urine cultures and blood cultures sent and are pending. Pelvic x-rays show bone meta stasis. WBC count within normal limits,   Hypokalemia and hypophosphatemia Replaced.  Liver mets She was initially scheduled for biopsy by IR as outpatient but at this time, didn't want to do it.   Odynophagia Added diflucan and nystatin.   Tachycardia:  Probably from fever.  EKG shows sinus tachycardia.  Prn metoprolol.     DVT prophylaxis: scd's  Code Status: (Full code) Family Communication: ( none at bedside) Disposition:   Status is: Inpatient.   The patient will require care spanning > 2 midnights and should be moved to inpatient because: Unsafe d/c plan and IV treatments appropriate due to intensity of illness or inability to take PO  Dispo: The patient is from: Home  Anticipated d/c is to: Home              Patient currently is not medically stable to d/c.   Difficult to place patient No       Consultants:  Palliative care consult.  Oncology  Procedures: None.   Antimicrobials: none.    Subjective: Not feeling good.   Objective: Vitals:   02/11/21 0431 02/11/21 0535 02/11/21 0656  02/11/21 1116  BP: (!) 164/93 139/84 (!) 133/101 (!) 159/103  Pulse: (!) 142 (!) 134 (!) 131 (!) 145  Resp: '18 18 18 18  '$ Temp: (!) 101.6 F (38.7 C) 99.1 F (37.3 C) 99.1 F (37.3 C) (!) 102.2 F (39 C)  TempSrc: Oral Axillary Oral Oral  SpO2: 94% 100% 98% 96%    Intake/Output Summary (Last 24 hours) at 02/11/2021 1350 Last data filed at 02/11/2021 0947 Gross per 24 hour  Intake 1001.3 ml  Output 3250 ml  Net -2248.7 ml    There were no vitals filed for this visit.  Examination:  General exam: cachetic looking, ill appearing, deconditioned lady, somnolent , not in distress.  Respiratory system: Clear to auscultation. Respiratory effort normal. Cardiovascular system: S1 & S2 heard, tachycardic. No JVD, murmurs, No pedal edema. Gastrointestinal system: Abdomen is  soft, bowel sounds wnl.  Central nervous system: somnolent.  Extremities: no pedal edema.  Skin: No rashes, Psychiatry: cannot be assessed due to lethargy.      Data Reviewed: I have personally reviewed following labs and imaging studies  CBC: Recent Labs  Lab 02/05/21 1214 02/06/21 0518 02/08/21 0517 02/09/21 0603  WBC 8.0 5.2 6.8 7.3  NEUTROABS 6.5  --   --  5.5  HGB 11.2* 8.4* 9.5* 8.8*  HCT 34.5* 24.9* 29.2* 27.2*  MCV 85.8 84.1 85.9 85.0  PLT 505* 375 426* 267     Basic Metabolic Panel: Recent Labs  Lab 02/05/21 1214 02/06/21 0518 02/08/21 0517 02/08/21 1635 02/09/21 0603 02/11/21 1158  NA 135 136 139  --  133* 136  K 3.5 3.7 3.1*  --  4.0 3.6  CL 99 105 103  --  99 100  CO2 '25 25 27  '$ --  27 27  GLUCOSE 108* 87 107*  --  92 120*  BUN '11 7 7  '$ --  <5* 6  CREATININE 0.58 0.51 0.63  --  0.37* 0.49  CALCIUM 10.8* 9.7 10.0  --  9.7 9.3  MG  --  2.0  --  2.2  --   --   PHOS  --  2.5  --  1.9*  --  3.1     GFR: CrCl cannot be calculated (Unknown ideal weight.).  Liver Function Tests: Recent Labs  Lab 02/05/21 1214  AST 46*  ALT 17  ALKPHOS 203*  BILITOT 0.6  PROT 9.8*   ALBUMIN 3.4*     CBG: No results for input(s): GLUCAP in the last 168 hours.   Recent Results (from the past 240 hour(s))  Resp Panel by RT-PCR (Flu A&B, Covid) Nasopharyngeal Swab     Status: None   Collection Time: 02/05/21 12:15 PM   Specimen: Nasopharyngeal Swab; Nasopharyngeal(NP) swabs in vial transport medium  Result Value Ref Range Status   SARS Coronavirus 2 by RT PCR NEGATIVE NEGATIVE Final    Comment: (NOTE) SARS-CoV-2 target nucleic acids are NOT DETECTED.  The SARS-CoV-2 RNA is generally detectable in upper respiratory specimens during the acute phase of infection. The lowest concentration of SARS-CoV-2 viral copies this  assay can detect is 138 copies/mL. A negative result does not preclude SARS-Cov-2 infection and should not be used as the sole basis for treatment or other patient management decisions. A negative result may occur with  improper specimen collection/handling, submission of specimen other than nasopharyngeal swab, presence of viral mutation(s) within the areas targeted by this assay, and inadequate number of viral copies(<138 copies/mL). A negative result must be combined with clinical observations, patient history, and epidemiological information. The expected result is Negative.  Fact Sheet for Patients:  EntrepreneurPulse.com.au  Fact Sheet for Healthcare Providers:  IncredibleEmployment.be  This test is no t yet approved or cleared by the [place] FDA and  has been authorized for detection and/or diagnosis of SARS-CoV-2 by FDA under an Emergency Use Authorization (EUA). This EUA will remain  in effect (meaning this test can be used) for the duration of the COVID-19 declaration under Section 564(b)(1) of the Act, 21 U.S.C.section 360bbb-3(b)(1), unless the authorization is terminated  or revoked sooner.       Influenza A by PCR NEGATIVE NEGATIVE Final   Influenza B by PCR NEGATIVE NEGATIVE Final     Comment: (NOTE) The Xpert Xpress SARS-CoV-2/FLU/RSV plus assay is intended as an aid in the diagnosis of influenza from Nasopharyngeal swab specimens and should not be used as a sole basis for treatment. Nasal washings and aspirates are unacceptable for Xpert Xpress SARS-CoV-2/FLU/RSV testing.  Fact Sheet for Patients: EntrepreneurPulse.com.au  Fact Sheet for Healthcare Providers: IncredibleEmployment.be  This test is not yet approved or cleared by the [place] FDA and has been authorized for detection and/or diagnosis of SARS-CoV-2 by FDA under an Emergency Use Authorization (EUA). This EUA will remain in effect (meaning this test can be used) for the duration of the COVID-19 declaration under Section 564(b)(1) of the Act, 21 U.S.C. section 360bbb-3(b)(1), unless the authorization is terminated or revoked.  Performed at [facility], [place] [address] [place], [place] [address]   Culture, blood (Routine X 2) w Reflex to ID Panel     Status: None (Preliminary result)   Collection Time: 02/08/21  4:35 PM   Specimen: BLOOD  Result Value Ref Range Status   Specimen Description   Final    BLOOD LEFT ANTECUBITAL Performed at [facility] [address], [place], [place] [address]    Special Requests   Final    BOTTLES DRAWN AEROBIC AND ANAEROBIC Blood Culture adequate volume Performed at [facility] [address], [place], [place] [address]    Culture   Final    NO GROWTH 2 DAYS Performed at [facility] [address] [place], [place] [address]    Report Status PENDING  Incomplete  Culture, blood (Routine X 2) w Reflex to ID Panel     Status: None (Preliminary result)   Collection Time: 02/08/21  4:35 PM   Specimen: BLOOD  Result Value Ref Range Status   Specimen Description   Final    BLOOD LEFT ANTECUBITAL Performed at [facility]  [address] [place], [place] [address]    Special Requests   Final    BOTTLES DRAWN AEROBIC AND ANAEROBIC Blood Culture results may not be optimal due to an excessive volume of blood received in culture bottles Performed at [facility] [address], [place], [place] [address]    Culture   Final    NO GROWTH 2 DAYS Performed at [facility], [address] [place], [place] [address]    Report Status PENDING  Incomplete          Radiology Studies: No results found.      Scheduled Meds:  bictegravir-emtricitabine-tenofovir AF  1 tablet Oral Daily   feeding supplement  237 mL Oral BID BM   fentaNYL  1 patch Transdermal Q72H   fluconazole  100 mg Oral Daily   metoprolol tartrate  25 mg Oral BID   multivitamin with minerals  1 tablet Oral Daily   nystatin   Topical TID   phosphorus  500 mg Oral TID   polyethylene glycol  17 g Oral BID   senna  2 tablet Oral BID   Continuous Infusions:  sodium chloride 75 mL/hr at 02/11/21 0245   azithromycin 500 mg (02/11/21 1112)   cefTRIAXone (ROCEPHIN)  IV 1 g (02/11/21 1027)     LOS: 5 days       Hosie Poisson, MD [facility]   To contact the attending provider between 7A-7P or the covering provider during after hours 7P-7A, please log into the web site [email or web address] and access using universal [place] password for that web site. If you do not have the password, please call the hospital operator.  02/11/2021, 1:50 PM

## 2021-02-12 MED ORDER — FLUCONAZOLE 100 MG PO TABS
200.0000 mg | ORAL_TABLET | Freq: Every day | ORAL | Status: DC
  Administered 2021-02-12 – 2021-02-15 (×4): 200 mg via ORAL
  Filled 2021-02-12 (×4): qty 2

## 2021-02-12 MED ORDER — HYDROMORPHONE HCL 2 MG PO TABS
1.0000 mg | ORAL_TABLET | Freq: Once | ORAL | Status: AC
Start: 2021-02-12 — End: 2021-02-12
  Administered 2021-02-12: 1 mg via ORAL
  Filled 2021-02-12: qty 1

## 2021-02-12 MED ORDER — MORPHINE SULFATE (PF) 2 MG/ML IV SOLN
2.0000 mg | INTRAVENOUS | Status: DC | PRN
  Administered 2021-02-12 – 2021-02-13 (×4): 4 mg via INTRAVENOUS
  Filled 2021-02-12 (×5): qty 2
  Filled 2021-02-12: qty 1

## 2021-02-12 NOTE — Progress Notes (Addendum)
Extensive time spent educating about pain management goals in relation to extensive condition of her metastatic disease. Pt agreeable to receive oral pain medications but is continuing to refuse IV Pain management related to a "fuzzy" feeling. Pt educated about common signs and symptoms of opioids regardless of Route of administration. Pt continues to refuse Iv management. This rn spoke with Olena Heckle NP about options for Oral pain management to combat patients complaints of severe pain. See order for x1 dilaudid 1 mg. Will continue to assess pain management and goals of care.

## 2021-02-12 NOTE — TOC Progression Note (Signed)
Transition of Care [facility]) - Progression Note    Patient Details  Name: Sameera Sprunk MRN: [id number] Date of Birth: Jul 23, 1970  Transition of Care [facility]) CM/SW Contact  Satsuki Zillmer, Marjie Skiff, RN Phone Number: 02/12/2021, 3:37 PM  Clinical Narrative:    [facility] consult for home hospice. Spoke with pt and sister at bedside about hospice care at home. Pt asking if she will get IV fluids at home if she is dehydrated. Pt informed that home fluid infusion is not done at home under hospice services. Pt asks that I come back and see her tomorrow as her and her sister need to "talk about things some more". MD informed of conversation and Palliative MD to see pt. TOC will follow up tomorrow.

## 2021-02-12 NOTE — Progress Notes (Signed)
PROGRESS NOTE    Ashley Ortega  [id number] DOB: 01-May-1971 DOA: 02/05/2021 PCP: Pcp, No (   Chief Complaint  Patient presents with   Shortness of Breath    Brief Narrative:  Ashley Ortega is a 50 y.o. female with medical history significant of metastatic breast cancer, incomplete chemotherapies in the past, metastasis to multiple bones/ vertebrae and liver who was here today for liver biopsy, found to have too much pain and discomfort so sent to emergency room.  Patient was in the ED for similar complaints recently and was discharged home on pain medications.  Patient was diagnosed with metastatic breast cancer in 2020.  She received 1 dose of IV chemotherapy and was not able to tolerate any further chemo's.  She was also recently found to have metastatic lesions in the liver.  And was scheduled for liver biopsy.  Patient was deemed not medically stable for procedure due to persistent pain and she was sent to ED for evaluation of pain control and dehydration.   Pain controlled with the regimen, after discussions with sister and oncology team, patient and family did not want any aggressive measures or interventions. They wanted to transition to home hospice .  TOC consult placed for initiation of hospice services.      Assessment & Plan:   Principal Problem:   Cancer related pain Active Problems:   Metastatic malignant neoplasm ([place])   Metastatic cancer (HCC)   Constipation   HIV (human immunodeficiency virus infection) ([place])   Stage IV metastatic breast cancer with mets to the liver and bone coming with severe pain, failure to thrive, severe protein calorie malnutrition. -Aggressive pain control with fentanyl patch 50 MCG, IV morphine every 2 hours as needed and restarted her home dose of oxycodone 10 mg every 6 hours as needed.suboptimally controlled.  Aggressive laxative regimen added by palliative care. Sister at bedside, and after further discussions, pt didn't want to  undergo biopsy. They do not want any aggressive measures or procedures. They would like to transition to hospice.  Poor prognosis.    Constipation:  Laxatives and stool softeners on board.    Anxiety:  Added prn xanax.     Anemia of chronic disease Baseline hemoglobin around 8.  Hemoglobin stable around 8.8 Hemoglobin on admission probably hemoconcentrated sample due to dehydration.   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  Protein calorie malnutrition:  Dietary consulted .   Failure to thrive.  Probably secondary to  liver mets    Fever Unclear etiology however she has some left  lower lobe atelectasis vs infiltrate on CT chest, will empirically treat for pneumonia. Suspect this is malignancy related fevers.  Urine analysis remains negative chest x-ray does not show any overt pneumonia, urine cultures and blood cultures are negative so far.  Pelvic x-rays show bone mets WBC count within normal limits,   Hypokalemia and hypophosphatemia Replaced.  Liver mets She was initially scheduled for biopsy by IR as outpatient but at this time, didn't want to do it.   Odynophagia Added diflucan and nystatin.   Tachycardia:  Probably from fever.  EKG shows sinus tachycardia.  Prn metoprolol.     DVT prophylaxis: scd's  Code Status: (Full code) Family Communication: ( none at bedside) Disposition:   Status is: Inpatient.   The patient will require care spanning > 2 midnights and should be moved to inpatient because: Unsafe d/c plan and IV treatments appropriate due to intensity of illness or inability to take PO  Dispo: The patient is from: Home              Anticipated d/c is to: Home              Patient currently is not medically stable to d/c.   Difficult to place patient No       Consultants:  Palliative care consult.  Oncology  Procedures: None.   Antimicrobials: none.    Subjective: Somnolent, wants to go home with hospice.    Objective: Vitals:   02/12/21 0030 02/12/21 0416 02/12/21 0500 02/12/21 1030  BP: 123/70 126/73  130/75  Pulse: (!) 129 (!) 124  (!) 124  Resp: '18 15  18  '$ Temp: (!) 100.6 F (38.1 C) 99.2 F (37.3 C)  98.6 F (37 C)  TempSrc: Oral Oral  Oral  SpO2: 95% 91%    Weight:   50 kg   Height:   '5\' 6"'$  (1.676 m)     Intake/Output Summary (Last 24 hours) at 02/12/2021 1237 Last data filed at 02/12/2021 [id number] Gross per 24 hour  Intake 1959.24 ml  Output 750 ml  Net 1209.24 ml    Filed Weights   02/12/21 0500  Weight: 50 kg    Examination:  General exam: Cachectic looking lady, somnolent, does not appear to be in any kind of distress Respiratory system: Diminished air entry at bases, no wheezing or rhonchi on room air Cardiovascular system: S1 & S2 heard, tachycardic, no JVD or pedal edema Gastrointestinal system: Abdomen is soft, mild generalized tenderness bowel sounds normal Central nervous system: Somnolent, able to move all extremities and answer simple questions Extremities: No pedal edema Skin: No rashes, lesions or ulcers Psychiatry: Unable to assess due to lethargy.       Data Reviewed: I have personally reviewed following labs and imaging studies  CBC: Recent Labs  Lab 02/06/21 0518 02/08/21 0517 02/09/21 0603  WBC 5.2 6.8 7.3  NEUTROABS  --   --  5.5  HGB 8.4* 9.5* 8.8*  HCT 24.9* 29.2* 27.2*  MCV 84.1 85.9 85.0  PLT 375 426* 267     Basic Metabolic Panel: Recent Labs  Lab 02/06/21 0518 02/08/21 0517 02/08/21 1635 02/09/21 0603 02/11/21 1158  NA 136 139  --  133* 136  K 3.7 3.1*  --  4.0 3.6  CL 105 103  --  99 100  CO2 25 27  --  27 27  GLUCOSE 87 107*  --  92 120*  BUN 7 7  --  <5* 6  CREATININE 0.51 0.63  --  0.37* 0.49  CALCIUM 9.7 10.0  --  9.7 9.3  MG 2.0  --  2.2  --   --   PHOS 2.5  --  1.9*  --  3.1     GFR: Estimated Creatinine Clearance: 68.6 mL/min (by C-G formula based on SCr of 0.49 mg/dL).  Liver Function Tests: No  results for input(s): AST, ALT, ALKPHOS, BILITOT, PROT, ALBUMIN in the last 168 hours.   CBG: No results for input(s): GLUCAP in the last 168 hours.   Recent Results (from the past 240 hour(s))  Resp Panel by RT-PCR (Flu A&B, Covid) Nasopharyngeal Swab     Status: None   Collection Time: 02/05/21 12:15 PM   Specimen: Nasopharyngeal Swab; Nasopharyngeal(NP) swabs in vial transport medium  Result Value Ref Range Status   SARS Coronavirus 2 by RT PCR NEGATIVE NEGATIVE Final    Comment: (NOTE) SARS-CoV-2 target nucleic acids are NOT  DETECTED.  The SARS-CoV-2 RNA is generally detectable in upper respiratory specimens during the acute phase of infection. The lowest concentration of SARS-CoV-2 viral copies this assay can detect is 138 copies/mL. A negative result does not preclude SARS-Cov-2 infection and should not be used as the sole basis for treatment or other patient management decisions. A negative result may occur with  improper specimen collection/handling, submission of specimen other than nasopharyngeal swab, presence of viral mutation(s) within the areas targeted by this assay, and inadequate number of viral copies(<138 copies/mL). A negative result must be combined with clinical observations, patient history, and epidemiological information. The expected result is Negative.  Fact Sheet for Patients:  EntrepreneurPulse.com.au  Fact Sheet for Healthcare Providers:  IncredibleEmployment.be  This test is no t yet approved or cleared by the [place] FDA and  has been authorized for detection and/or diagnosis of SARS-CoV-2 by FDA under an Emergency Use Authorization (EUA). This EUA will remain  in effect (meaning this test can be used) for the duration of the COVID-19 declaration under Section 564(b)(1) of the Act, 21 U.S.C.section 360bbb-3(b)(1), unless the authorization is terminated  or revoked sooner.       Influenza A by PCR  NEGATIVE NEGATIVE Final   Influenza B by PCR NEGATIVE NEGATIVE Final    Comment: (NOTE) The Xpert Xpress SARS-CoV-2/FLU/RSV plus assay is intended as an aid in the diagnosis of influenza from Nasopharyngeal swab specimens and should not be used as a sole basis for treatment. Nasal washings and aspirates are unacceptable for Xpert Xpress SARS-CoV-2/FLU/RSV testing.  Fact Sheet for Patients: EntrepreneurPulse.com.au  Fact Sheet for Healthcare Providers: IncredibleEmployment.be  This test is not yet approved or cleared by the [place] FDA and has been authorized for detection and/or diagnosis of SARS-CoV-2 by FDA under an Emergency Use Authorization (EUA). This EUA will remain in effect (meaning this test can be used) for the duration of the COVID-19 declaration under Section 564(b)(1) of the Act, 21 U.S.C. section 360bbb-3(b)(1), unless the authorization is terminated or revoked.  Performed at [facility], [place] [address], [place], [place] [address]   Culture, blood (Routine X 2) w Reflex to ID Panel     Status: None (Preliminary result)   Collection Time: 02/08/21  4:35 PM   Specimen: BLOOD  Result Value Ref Range Status   Specimen Description   Final    BLOOD LEFT ANTECUBITAL Performed at [facility] [address], [place], [place] [address]    Special Requests   Final    BOTTLES DRAWN AEROBIC AND ANAEROBIC Blood Culture adequate volume Performed at [facility] [address], [place], [place] [address]    Culture   Final    NO GROWTH 4 DAYS Performed at [facility], [place] [address] [place], [place] [address]    Report Status PENDING  Incomplete  Culture, blood (Routine X 2) w Reflex to ID Panel     Status: None (Preliminary result)   Collection Time: 02/08/21  4:35 PM   Specimen: BLOOD  Result Value Ref Range Status   Specimen Description   Final    BLOOD LEFT  ANTECUBITAL Performed at [facility] [address], [place], [place] [address]    Special Requests   Final    BOTTLES DRAWN AEROBIC AND ANAEROBIC Blood Culture results may not be optimal due to an excessive volume of blood received in culture bottles Performed at [facility]  [address], [place], [place] [address]    Culture   Final    NO GROWTH 4 DAYS Performed at [facility], [place] [address], [place], [place] [address]    Report Status PENDING  Incomplete          Radiology Studies: No results found.      Scheduled Meds:  bictegravir-emtricitabine-tenofovir AF  1 tablet Oral Daily   feeding supplement  237 mL Oral TID BM   fentaNYL  1 patch Transdermal Q72H   fluconazole  200 mg Oral Daily   metoprolol tartrate  25 mg Oral BID   multivitamin with minerals  1 tablet Oral Daily   nystatin   Topical TID   phosphorus  500 mg Oral TID   polyethylene glycol  17 g Oral BID   senna  2 tablet Oral BID   Continuous Infusions:     LOS: 6 days       Hosie Poisson, MD [facility]   To contact the attending provider between 7A-7P or the covering provider during after hours 7P-7A, please log into the web site [email or web address] and access using universal [place] password for that web site. If you do not have the password, please call the hospital operator.  02/12/2021, 12:37 PM

## 2021-02-12 NOTE — Progress Notes (Signed)
Pt sleeping, xanax medication effective. Call bell within reach and RN number on board

## 2021-02-12 NOTE — Progress Notes (Signed)
Daily Progress Note   Patient Name: Ashley Ortega       Date: 02/12/2021 DOB: 1971/05/09  Age: 50 y.o. MRN#: [id number] Attending Physician: Hosie Poisson, MD Primary Care Physician: Pcp, No Admit Date: 02/05/2021  Reason for Consultation/Follow-up: Establishing goals of care  Subjective: I saw and examined Ashley Ortega today.  She is resting in bed, complains of back and abdominal pain, she has just received medication, she requests to be repositioned in bed, she is in mild distress, appears weaker.   Length of Stay: 6  Current Medications: Scheduled Meds:  . bictegravir-emtricitabine-tenofovir AF  1 tablet Oral Daily  . feeding supplement  237 mL Oral TID BM  . fentaNYL  1 patch Transdermal Q72H  . fluconazole  200 mg Oral Daily  . metoprolol tartrate  25 mg Oral BID  . multivitamin with minerals  1 tablet Oral Daily  . nystatin   Topical TID  . phosphorus  500 mg Oral TID  . polyethylene glycol  17 g Oral BID  . senna  2 tablet Oral BID    Continuous Infusions:    PRN Meds: acetaminophen **OR** acetaminophen, ALPRAZolam, alum & mag hydroxide-simeth, metoprolol tartrate, morphine injection, ondansetron **OR** ondansetron (ZOFRAN) IV, oxyCODONE  Physical Exam         General: Alert, awake, in mild distress.   HEENT: No bruits, no goiter, no JVD Heart: Regular rate and rhythm. No murmur appreciated. Lungs: Good air movement, clear Abdomen: Soft, nontender, distended, positive bowel sounds.   Ext: No significant edema Skin: Warm and dry Neuro: Grossly intact, nonfocal.   Vital Signs: BP (!) 154/100 (BP Location: Left Arm)   Pulse (!) 124   Temp 99.5 F (37.5 C) (Oral)   Resp 18   Ht '5\' 6"'$  (1.676 m)   Wt 50 kg   SpO2 96%   BMI 17.79 kg/m  SpO2: SpO2: 96 % O2  Device: O2 Device: Room Air O2 Flow Rate:    Intake/output summary:  Intake/Output Summary (Last 24 hours) at 02/12/2021 1416 Last data filed at 02/12/2021 [id number] Gross per 24 hour  Intake 1959.24 ml  Output 750 ml  Net 1209.24 ml    LBM: Last BM Date: 02/11/21 Baseline Weight: Weight: 50 kg Most recent weight: Weight: 50 kg       Palliative Assessment/Data:  Flowsheet Rows    Flowsheet Row Most Recent Value  Intake Tab   Referral Department Hospitalist  Unit at Time of Referral Oncology Unit  Palliative Care Primary Diagnosis Cancer  Date Notified 02/06/21  Palliative Care Type New Palliative care  Reason for referral Clarify Goals of Care, Pain  Date of Admission 02/05/21  Date first seen by Palliative Care 02/07/21  # of days Palliative referral response time 1 Day(s)  # of days IP prior to Palliative referral 1  Clinical Assessment   Palliative Performance Scale Score 40%  Psychosocial & Spiritual Assessment   Palliative Care Outcomes   Patient/Family meeting held? Yes  Who was at the meeting? Patient  Palliative Care Outcomes Clarified goals of care, Improved pain interventions       Patient Active Problem List   Diagnosis Date Noted  . Metastatic cancer ([place]) 02/05/2021  . Cancer related pain 02/05/2021  . Constipation 02/05/2021  . HIV (human immunodeficiency virus infection) ([place]) 02/05/2021  . Metastatic breast cancer ([place]) 01/23/2021  . Metastatic malignant neoplasm ([place]) 01/21/2021  . Palliative care patient 01/21/2021  . Malnutrition of moderate degree 01/16/2021    Palliative Care Assessment & Plan   Patient Profile:  50 y.o. 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    Recommendations/Plan: DNR  DNI Home with hospice, as per discussions with [facility] MD. Agree with shifting focus towards ensuring comfort measures, avoidance of suffering and provision for hospice.  TOC consulted for home with hospice. Patient endorses current plan as being the one that is most concordant with her wishes right now.  Continue current pain and non pain symptom management medications.     Goals of Care and Additional Recommendations: Now DNR and plans being made for home with hospice, under sister's care.   Code Status: now DNR.     Code Status Orders  (From admission, onward)           Start     Ordered   02/05/21 1835  Full code  Continuous        02/05/21 1834           Code Status History     Date Active Date Inactive Code Status Order ID Comments User Context   01/14/2021 1822 01/21/2021 1842 Full Code [id number]  Lequita Halt, MD ED      Advance Directive Documentation    Flowsheet Row Most Recent Value  Type of Advance Directive Healthcare Power of Attorney, Living will  Pre-existing out of facility DNR order (yellow form or pink MOST form) --  "MOST" Form in Place? --       Prognosis:  Likely less than 3-4 weeks at this point.   Discharge Planning: Home with hospice.   Care plan was discussed with patient, friends at bedside  Thank you for allowing the Palliative Medicine Team to assist in the care of this patient.   Total Time 25 Prolonged Time Billed No   Greater than 50%  of this time was spent counseling and coordinating care related to the above assessment and plan.   Loistine Chance, MD  Please contact Palliative Medicine Team phone at [phone] for questions and concerns from 7 AM to 7 PM. After 7 PM, please contact primary service.

## 2021-02-12 NOTE — Plan of Care (Signed)

## 2021-02-12 NOTE — Progress Notes (Signed)
PHARMACY NOTE:  ANTIMICROBIAL RENAL DOSAGE ADJUSTMENT  Current antimicrobial regimen includes a mismatch between antimicrobial dosage and estimated renal function.  As per policy approved by the Pharmacy & Therapeutics and Medical Executive Committees, the antimicrobial dosage will be adjusted accordingly.  Current antimicrobial dosage:  fluconazole 100 mg PO daily  Indication: Odynophagia, possible esophageal candidiasis  Renal Function:  Estimated Creatinine Clearance: 68.6 mL/min (by C-G formula based on SCr of 0.49 mg/dL).    Antimicrobial dosage has been changed to:  Fluconazole 200 mg PO daily  Thank you for allowing pharmacy to be a part of this patient's care.  Lenis Noon, PharmD 02/12/21 9:22 AM

## 2021-02-12 NOTE — Progress Notes (Signed)
    OVERNIGHT PROGRESS REPORT  Notified by Rn that Patient refuses Morphine or IV med.  Trial of PO medication started  Gershon Cull MSNA MSN [place]

## 2021-02-13 DIAGNOSIS — K5900 Constipation, unspecified: Secondary | ICD-10-CM

## 2021-02-13 DIAGNOSIS — R531 Weakness: Secondary | ICD-10-CM

## 2021-02-13 LAB — CULTURE, BLOOD (ROUTINE X 2)
Culture: NO GROWTH
Culture: NO GROWTH
Special Requests: ADEQUATE

## 2021-02-13 MED ORDER — OXYCODONE HCL 5 MG/5ML PO SOLN
7.5000 mg | ORAL | Status: DC | PRN
  Administered 2021-02-13 – 2021-02-14 (×6): 7.5 mg via ORAL
  Filled 2021-02-13 (×6): qty 10

## 2021-02-13 MED ORDER — HYDROMORPHONE HCL 1 MG/ML IJ SOLN
1.0000 mg | Freq: Once | INTRAMUSCULAR | Status: AC
Start: 2021-02-13 — End: 2021-02-13
  Administered 2021-02-13: 1 mg via INTRAVENOUS
  Filled 2021-02-13: qty 1

## 2021-02-13 MED ORDER — HYDROMORPHONE HCL 2 MG PO TABS
1.0000 mg | ORAL_TABLET | Freq: Once | ORAL | Status: DC
  Filled 2021-02-13: qty 1

## 2021-02-13 NOTE — Progress Notes (Signed)
Daily Progress Note   Patient Name: Ashley Ortega       Date: 02/13/2021 DOB: 1971-05-10  Age: 50 y.o. MRN#: [id number] Attending Physician: Caren Griffins, MD Primary Care Physician: Pcp, No Admit Date: 02/05/2021  Reason for Consultation/Follow-up: Establishing goals of care  Subjective: I saw and examined Ashley Ortega today.  She is resting in bed, complains of back and abdominal pain, she has just received medication, she requests to be repositioned in bed, she is in mild to moderate distress, continues to appears weaker.   Overnight events noted, patient continues to have escalating pain and known pain symptom burden. An interdisciplinary meeting was held with the patient, her sister on the phone from [place], nursing colleagues as well as [facility] colleague Alinda Sierras all present in the room.  Also discussed with Dr. Cruzita Lederer earlier today.  See below.  Next Length of Stay: 7  Current Medications: Scheduled Meds:  . bictegravir-emtricitabine-tenofovir AF  1 tablet Oral Daily  . feeding supplement  237 mL Oral TID BM  . fentaNYL  1 patch Transdermal Q72H  . fluconazole  200 mg Oral Daily  .  HYDROmorphone (DILAUDID) injection  1 mg Intravenous Once  . metoprolol tartrate  25 mg Oral BID  . multivitamin with minerals  1 tablet Oral Daily  . nystatin   Topical TID  . phosphorus  500 mg Oral TID  . polyethylene glycol  17 g Oral BID  . senna  2 tablet Oral BID    Continuous Infusions:    PRN Meds: acetaminophen **OR** acetaminophen, ALPRAZolam, alum & mag hydroxide-simeth, metoprolol tartrate, morphine injection, ondansetron **OR** ondansetron (ZOFRAN) IV, oxyCODONE  Physical Exam         General: Alert, awake, in mild distress.   HEENT: No bruits, no goiter, no JVD Heart: Regular  rate and rhythm. No murmur appreciated. Lungs: Good air movement, clear Abdomen: Soft, nontender, distended, positive bowel sounds.   Ext: No significant edema Skin: Warm and dry Neuro: Grossly intact, nonfocal.   Vital Signs: BP 104/66   Pulse (!) 108   Temp 98.6 F (37 C) (Oral)   Resp 18   Ht '5\' 6"'$  (1.676 m)   Wt 50 kg   SpO2 97%   BMI 17.79 kg/m  SpO2: SpO2: 97 % O2 Device: O2 Device: Room [employer]  O2 Flow Rate:    Intake/output summary:  Intake/Output Summary (Last 24 hours) at 02/13/2021 1200 Last data filed at 02/13/2021 1058 Gross per 24 hour  Intake 720 ml  Output 900 ml  Net -180 ml    LBM: Last BM Date: 02/11/21 Baseline Weight: Weight: 50 kg Most recent weight: Weight: 50 kg       Palliative Assessment/Data:    Flowsheet Rows    Flowsheet Row Most Recent Value  Intake Tab   Referral Department Hospitalist  Unit at Time of Referral Oncology Unit  Palliative Care Primary Diagnosis Cancer  Date Notified 02/06/21  Palliative Care Type New Palliative care  Reason for referral Clarify Goals of Care, Pain  Date of Admission 02/05/21  Date first seen by Palliative Care 02/07/21  # of days Palliative referral response time 1 Day(s)  # of days IP prior to Palliative referral 1  Clinical Assessment   Palliative Performance Scale Score 40%  Psychosocial & Spiritual Assessment   Palliative Care Outcomes   Patient/Family meeting held? Yes  Who was at the meeting? Patient  Palliative Care Outcomes Clarified goals of care, Improved pain interventions       Patient Active Problem List   Diagnosis Date Noted  . Metastatic cancer ([place]) 02/05/2021  . Cancer related pain 02/05/2021  . Constipation 02/05/2021  . HIV (human immunodeficiency virus infection) ([place]) 02/05/2021  . Metastatic breast cancer ([place]) 01/23/2021  . Metastatic malignant neoplasm ([place]) 01/21/2021  . Palliative care patient 01/21/2021  . Malnutrition of moderate degree 01/16/2021     Palliative Care Assessment & Plan   Patient Profile:  50 y.o. 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    Recommendations/Plan: DNR DNI Goals of care discussions were again held.  Reviewed with the patient about her current condition as well as the proposed treatment plan.  Reviewed with her again about hospice philosophy of care, discussed about differences between home with hospice versus residential hospice.  Discussed about full scope of comfort measures.  Discussed about judicious use of opioids and to carefully balance awakeness and alertness with adequate pain control.  To this end, principle of double effect also explained.  Tried liquid oxycodone for pain, continue with other pain and on pain symptom management, medication history reviewed.  Prognosis does appear to be markedly limited, could be 2 weeks or less for life expectancy, patient could benefit greatly from residential hospice for aggressive symptom management in my opinion.  Continue conversations with patient and family regarding appropriateness of residential hospice.     Goals of Care and Additional Recommendations: Now DNR and plans being made for exploring further options with residential hospice.  Code Status: now DNR.     Code Status Orders  (From admission, onward)           Start     Ordered   02/05/21 1835  Full code  Continuous        02/05/21 1834           Code Status History     Date Active Date Inactive Code Status Order ID Comments User Context   01/14/2021 1822 01/21/2021 1842 Full Code [id number]  Lequita Halt, MD ED      Advance Directive Documentation    Flowsheet  Row Most Recent Value  Type of Advance Directive Healthcare Power of  Attorney, Living will  Pre-existing out of facility DNR order (yellow form or pink MOST form) --  "MOST" Form in Place? --       Prognosis:  Likely less than 2 weeks Discharge Planning: Possibly residential hospice.   Care plan was discussed with patient, and interdisciplinary team as mentioned above. Thank you for allowing the Palliative Medicine Team to assist in the care of this patient.   Total Time 35 Prolonged Time Billed No   Greater than 50%  of this time was spent counseling and coordinating care related to the above assessment and plan.   Loistine Chance, MD  Please contact Palliative Medicine Team phone at [phone] for questions and concerns from 7 AM to 7 PM. After 7 PM, please contact primary service.

## 2021-02-13 NOTE — Progress Notes (Signed)
Pt had bladder distention. Pt had 438m in bladder. MD put in order to in and out and collect urine samples. Pt voided before IN and OUT could be done. [facility] next void

## 2021-02-13 NOTE — Progress Notes (Signed)
PROGRESS NOTE  Ashley Ortega [id number] DOB: 11/16/1970 DOA: 02/05/2021 PCP: Pcp, No   LOS: 7 days   Brief Narrative / Interim history: 50 year old female with metastatic breast cancer, incomplete chemotherapy in the past (received chemo x1 and was not able to tolerate further chemo), mets to multiple bones/vertebral/liver who has been admitted for worsening metastatic disease related pain.  Subjective / 24h Interval events: Complains of back pain  Assessment & Plan: Principal Problem Stage IV metastatic breast cancer with mets to the liver/bone causing severe pain, FTT, severe protein calorie malnutrition-appreciate palliative follow-up, currently continue fentanyl patch along with morphine, oxycodone. -After discussing goals of care plans are in place for her to be transferred to residential hospice given symptom burden and escalating pain needs  Active Problems HIV disease-continue Biktarvy Severe protein calorie malnutrition-dietary consulted Hypokalemia, hypophosphatemia-replete as indicated Anemia of chronic disease-stable, no bleeding Anxiety-continue as needed Xanax Fever 9/12-possibly an infiltrate on the chest x-ray, received antibiotics, afebrile now, normal WBC.  Possibly related to malignancy Liver mets -She was initially scheduled for biopsy by IR as outpatient but at this time, didn't want to do it.  Odynophagia -Added diflucan and nystatin.  Sinus tachycardia- Probably from fever.    Scheduled Meds:  bictegravir-emtricitabine-tenofovir AF  1 tablet Oral Daily   feeding supplement  237 mL Oral TID BM   fentaNYL  1 patch Transdermal Q72H   fluconazole  200 mg Oral Daily   metoprolol tartrate  25 mg Oral BID   multivitamin with minerals  1 tablet Oral Daily   nystatin   Topical TID   phosphorus  500 mg Oral TID   polyethylene glycol  17 g Oral BID   senna  2 tablet Oral BID   Continuous Infusions: PRN Meds:.acetaminophen **OR** acetaminophen, ALPRAZolam,  alum & mag hydroxide-simeth, metoprolol tartrate, morphine injection, ondansetron **OR** ondansetron (ZOFRAN) IV, oxyCODONE  Diet Orders (From admission, onward)     Start     Ordered   02/11/21 1307  Diet regular Room service appropriate? Yes; Fluid consistency: Thin  Diet effective now       Question Answer Comment  Room service appropriate? Yes   Fluid consistency: Thin      02/11/21 1306            DVT prophylaxis: Place and maintain sequential compression device Start: 02/07/21 1454     Code Status: DNR  Family Communication: No family at bedside  Status is: Inpatient  Remains inpatient appropriate because:Inpatient level of care appropriate due to severity of illness  Dispo: The patient is from: Home              Anticipated d/c is to:  Residential hospice              Patient currently is not medically stable to d/c.   Difficult to place patient No  Level of care: Med-Surg  Consultants:  Oncology Palliative care  Procedures:  none  Microbiology  none  Antimicrobials: none    Objective: Vitals:   02/12/21 2000 02/12/21 2200 02/13/21 0023 02/13/21 0357  BP: (!) 144/97  95/67 104/66  Pulse: (!) 146 (!) 122 92 (!) 108  Resp: '18 18 18 18  '$ Temp:  99.7 F (37.6 C) 97.8 F (36.6 C) 98.6 F (37 C)  TempSrc:  Oral  Oral  SpO2: 95%  98% 97%  Weight:      Height:        Intake/Output Summary (Last 24 hours) at 02/13/2021 1340 Last data  filed at 02/13/2021 1058 Gross per 24 hour  Intake 720 ml  Output 900 ml  Net -180 ml   Filed Weights   02/12/21 0500  Weight: 50 kg    Examination:  Constitutional: Lethargic, cachectic appearing Eyes: no scleral icterus ENMT: Mucous membranes are moist.  Neck: normal, supple Respiratory: clear to auscultation bilaterally, no wheezing, no crackles. Normal respiratory effort.  Cardiovascular: Regular rate and rhythm, no murmurs / rubs / gallops. No LE edema. Good peripheral pulses Musculoskeletal: no  clubbing / cyanosis.  Skin: no rashes Neurologic: No focal deficits  Data Reviewed: I have independently reviewed following labs and imaging studies   CBC: Recent Labs  Lab 02/08/21 0517 02/09/21 0603  WBC 6.8 7.3  NEUTROABS  --  5.5  HGB 9.5* 8.8*  HCT 29.2* 27.2*  MCV 85.9 85.0  PLT 426* [id number]   Basic Metabolic Panel: Recent Labs  Lab 02/08/21 0517 02/08/21 1635 02/09/21 0603 02/11/21 1158  NA 139  --  133* 136  K 3.1*  --  4.0 3.6  CL 103  --  99 100  CO2 27  --  27 27  GLUCOSE 107*  --  92 120*  BUN 7  --  <5* 6  CREATININE 0.63  --  0.37* 0.49  CALCIUM 10.0  --  9.7 9.3  MG  --  2.2  --   --   PHOS  --  1.9*  --  3.1   Liver Function Tests: No results for input(s): AST, ALT, ALKPHOS, BILITOT, PROT, ALBUMIN in the last 168 hours. Coagulation Profile: Recent Labs  Lab 02/11/21 0458  INR 1.1   HbA1C: No results for input(s): HGBA1C in the last 72 hours. CBG: No results for input(s): GLUCAP in the last 168 hours.  Recent Results (from the past 240 hour(s))  Resp Panel by RT-PCR (Flu A&B, Covid) Nasopharyngeal Swab     Status: None   Collection Time: 02/05/21 12:15 PM   Specimen: Nasopharyngeal Swab; Nasopharyngeal(NP) swabs in vial transport medium  Result Value Ref Range Status   SARS Coronavirus 2 by RT PCR NEGATIVE NEGATIVE Final    Comment: (NOTE) SARS-CoV-2 target nucleic acids are NOT DETECTED.  The SARS-CoV-2 RNA is generally detectable in upper respiratory specimens during the acute phase of infection. The lowest concentration of SARS-CoV-2 viral copies this assay can detect is 138 copies/mL. A negative result does not preclude SARS-Cov-2 infection and should not be used as the sole basis for treatment or other patient management decisions. A negative result may occur with  improper specimen collection/handling, submission of specimen other than nasopharyngeal swab, presence of viral mutation(s) within the areas targeted by this assay, and  inadequate number of viral copies(<138 copies/mL). A negative result must be combined with clinical observations, patient history, and epidemiological information. The expected result is Negative.  Fact Sheet for Patients:  EntrepreneurPulse.com.au  Fact Sheet for Healthcare Providers:  IncredibleEmployment.be  This test is no t yet approved or cleared by the [place] FDA and  has been authorized for detection and/or diagnosis of SARS-CoV-2 by FDA under an Emergency Use Authorization (EUA). This EUA will remain  in effect (meaning this test can be used) for the duration of the COVID-19 declaration under Section 564(b)(1) of the Act, 21 U.S.C.section 360bbb-3(b)(1), unless the authorization is terminated  or revoked sooner.       Influenza A by PCR NEGATIVE NEGATIVE Final   Influenza B by PCR NEGATIVE NEGATIVE Final    Comment: (NOTE)  The Xpert Xpress SARS-CoV-2/FLU/RSV plus assay is intended as an aid in the diagnosis of influenza from Nasopharyngeal swab specimens and should not be used as a sole basis for treatment. Nasal washings and aspirates are unacceptable for Xpert Xpress SARS-CoV-2/FLU/RSV testing.  Fact Sheet for Patients: EntrepreneurPulse.com.au  Fact Sheet for Healthcare Providers: IncredibleEmployment.be  This test is not yet approved or cleared by the [place] FDA and has been authorized for detection and/or diagnosis of SARS-CoV-2 by FDA under an Emergency Use Authorization (EUA). This EUA will remain in effect (meaning this test can be used) for the duration of the COVID-19 declaration under Section 564(b)(1) of the Act, 21 U.S.C. section 360bbb-3(b)(1), unless the authorization is terminated or revoked.  Performed at [facility], [place] [address], [place], [place] [address]   Culture, blood (Routine X 2) w Reflex to ID Panel     Status: None   Collection  Time: 02/08/21  4:35 PM   Specimen: BLOOD  Result Value Ref Range Status   Specimen Description   Final    BLOOD LEFT ANTECUBITAL Performed at [facility] [address], [place], [place] [address]    Special Requests   Final    BOTTLES DRAWN AEROBIC AND ANAEROBIC Blood Culture adequate volume Performed at [facility] [address] [place], [place] [address]    Culture   Final    NO GROWTH 5 DAYS Performed at [facility], [place] [address], [place], [place] [address]    Report Status 02/13/2021 FINAL  Final  Culture, blood (Routine X 2) w Reflex to ID Panel     Status: None   Collection Time: 02/08/21  4:35 PM   Specimen: BLOOD  Result Value Ref Range Status   Specimen Description   Final    BLOOD LEFT ANTECUBITAL Performed at [facility] [address] [place], [place] [address]    Special Requests   Final    BOTTLES DRAWN AEROBIC AND ANAEROBIC Blood Culture results may not be optimal due to an excessive volume of blood received in culture bottles Performed at [facility] [address] [place], [place] [address]    Culture   Final    NO GROWTH 5 DAYS Performed at [facility], [place] [address], [place], [place] [address]    Report Status 02/13/2021 FINAL  Final     Radiology Studies: No results found.  Marzetta Board, MD, PhD [facility]  Between 7 am - 7 pm I am available, please contact me via Amion (for emergencies) or Securechat (non urgent messages)  Between 7 pm - 7 am I am not available, please contact night coverage MD/APP via Amion

## 2021-02-13 NOTE — Progress Notes (Signed)
Chaplain engaged in a follow-up visit with Ashley Ortega.  Chaplain met Ashley Ortega some time ago on a previous visit. Chaplain was able to meet Ashley Ortega's sister this time.  Sister described Ashley Ortega as being popular and loved by many from various aspects of life.  She currently has friends who have been checking on her throughout her healthcare journey and have created organic and healthy meals for her to last over a long period of time.  Ashley Ortega and her sister are also very close, and have one other sister in [place].  They all are originally from [place] and migrated to [place] to [place].  Their mom is also currently enduring her own healthcare challenges out of state.    Sister is committed to being by Ashley Ortega's side and offering her the support she needs while being hospitalized.  She desires for Ashley Ortega to receive the care that she deserves.    Chaplain offered support, listening, and presence.  Chaplain will follow-up.     02/13/21 1400  Clinical Encounter Type  Visited With Patient and family together  Visit Type Follow-up;Spiritual support  Referral From Palliative care team  Consult/Referral To Chaplain

## 2021-02-13 NOTE — Progress Notes (Signed)
Pt triggered red mews, RN notified MD, see new orders. RN gave prn tylenol for elevated temp.

## 2021-02-13 NOTE — Progress Notes (Signed)
   02/13/21 1420  Assess: MEWS Score  Temp (!) 101.8 F (38.8 C)  BP 110/67  Pulse Rate (!) 123  Resp 14  SpO2 97 %  Assess: MEWS Score  MEWS Temp 2  MEWS Systolic 0  MEWS Pulse 2  MEWS RR 0  MEWS LOC 0  MEWS Score 4  MEWS Score Color Red  Assess: if the MEWS score is Yellow or Red  Were vital signs taken at a resting state? Yes  Focused Assessment No change from prior assessment  Does the patient meet 2 or more of the SIRS criteria? Yes  Does the patient have a confirmed or suspected source of infection? Yes  Provider and Rapid Response Notified? Yes  MEWS guidelines implemented *See Row Information* No, previously yellow, continue vital signs every 4 hours  Treat  MEWS Interventions Administered scheduled meds/treatments;Administered prn meds/treatments  Pain Scale 0-10  Pain Score 7  Take Vital Signs  Increase Vital Sign Frequency  Red: Q 1hr X 4 then Q 4hr X 4, if remains red, continue Q 4hrs  Escalate  MEWS: Escalate Red: discuss with charge nurse/RN and provider, consider discussing with RRT  Notify: Charge Nurse/RN  Name of Charge Nurse/RN Notified Keiva RN  Date Charge Nurse/RN Notified 02/13/21  Notify: Provider  Provider Name/Title Marzetta Board MD  Date Provider Notified 02/13/21  Time Provider Notified 1500  Notification Type Page  Provider response See new orders  Date of Provider Response 02/13/21  Notify: Rapid Response  Name of Rapid Response RN Notified Charles RN  Date Rapid Response Notified 02/13/21  Time Rapid Response Notified [id number]  Document  Patient Outcome Stabilized after interventions  Assess: SIRS CRITERIA  SIRS Temperature  1  SIRS Pulse 1  SIRS Respirations  0  SIRS WBC 0  SIRS Score Sum  2

## 2021-02-14 ENCOUNTER — Inpatient Hospital Stay: Payer: Medicaid Other | Attending: Oncology | Admitting: Hematology and Oncology

## 2021-02-14 DIAGNOSIS — R52 Pain, unspecified: Secondary | ICD-10-CM

## 2021-02-14 MED ORDER — OXYCODONE HCL 5 MG/5ML PO SOLN
10.0000 mg | ORAL | Status: DC | PRN
  Administered 2021-02-14 – 2021-02-15 (×4): 10 mg via ORAL
  Filled 2021-02-14 (×4): qty 10

## 2021-02-14 MED ORDER — IBUPROFEN 200 MG PO TABS
200.0000 mg | ORAL_TABLET | Freq: Three times a day (TID) | ORAL | Status: AC | PRN
Start: 2021-02-14 — End: 2021-02-14
  Administered 2021-02-14: 200 mg via ORAL
  Filled 2021-02-14: qty 1

## 2021-02-14 NOTE — Progress Notes (Signed)
Daily Progress Note   Patient Name: Ashley Ortega       Date: 02/14/2021 DOB: April 30, 1971  Age: 50 y.o. MRN#: [id number] Attending Physician: Caren Griffins, MD Primary Care Physician: Pcp, No Admit Date: 02/05/2021  Reason for Consultation/Follow-up: Establishing goals of care  Subjective: Ashley Ortega is awake alert, currently sitting up on the bedside commode, she is able to feed herself breakfast. Her sister is also on the phone. We talked about our meeting yesterday, her pain and non pain symptom management as well as most appropriate disposition options. Patient says, "I thought I was going home today. My sister works from home, she can be with me all day.I want to go home."   Medication history reviewed, patient now on PO liquid Oxycodone IR. Took 4 doses of 7.5 mg PO in the past 24 hours.     Length of Stay: 8  Current Medications: Scheduled Meds:  . bictegravir-emtricitabine-tenofovir AF  1 tablet Oral Daily  . feeding supplement  237 mL Oral TID BM  . fentaNYL  1 patch Transdermal Q72H  . fluconazole  200 mg Oral Daily  . HYDROmorphone  1 mg Oral Once  . metoprolol tartrate  25 mg Oral BID  . multivitamin with minerals  1 tablet Oral Daily  . nystatin   Topical TID  . phosphorus  500 mg Oral TID  . polyethylene glycol  17 g Oral BID  . senna  2 tablet Oral BID    Continuous Infusions:    PRN Meds: acetaminophen **OR** acetaminophen, ALPRAZolam, alum & mag hydroxide-simeth, metoprolol tartrate, ondansetron **OR** ondansetron (ZOFRAN) IV, oxyCODONE  Physical Exam         General: Alert, awake, no distress today.  HEENT: No bruits, no goiter, no JVD Heart: Regular rate and rhythm. No murmur appreciated. Lungs: Good air movement, clear Abdomen: Soft, nontender,  distended, positive bowel sounds.   Ext: No significant edema Skin: Warm and dry Neuro: Grossly intact, nonfocal.   Vital Signs: BP 97/74 (BP Location: Left Arm)   Pulse 96   Temp 98.2 F (36.8 C) (Oral)   Resp 18   Ht '5\' 6"'$  (1.676 m)   Wt 50 kg   SpO2 98%   BMI 17.79 kg/m  SpO2: SpO2: 98 % O2 Device: O2 Device: Room Air O2 Flow Rate:  Intake/output summary:  Intake/Output Summary (Last 24 hours) at 02/14/2021 1005 Last data filed at 02/14/2021 0801 Gross per 24 hour  Intake 600 ml  Output 300 ml  Net 300 ml    LBM: Last BM Date: 02/11/21 Baseline Weight: Weight: 50 kg Most recent weight: Weight: 50 kg       Palliative Assessment/Data:    Flowsheet Rows    Flowsheet Row Most Recent Value  Intake Tab   Referral Department Hospitalist  Unit at Time of Referral Oncology Unit  Palliative Care Primary Diagnosis Cancer  Date Notified 02/06/21  Palliative Care Type New Palliative care  Reason for referral Clarify Goals of Care, Pain  Date of Admission 02/05/21  Date first seen by Palliative Care 02/07/21  # of days Palliative referral response time 1 Day(s)  # of days IP prior to Palliative referral 1  Clinical Assessment   Palliative Performance Scale Score 40%  Psychosocial & Spiritual Assessment   Palliative Care Outcomes   Patient/Family meeting held? Yes  Who was at the meeting? Patient  Palliative Care Outcomes Clarified goals of care, Improved pain interventions       Patient Active Problem List   Diagnosis Date Noted  . Metastatic cancer ([place]) 02/05/2021  . Cancer related pain 02/05/2021  . Constipation 02/05/2021  . HIV (human immunodeficiency virus infection) ([place]) 02/05/2021  . Metastatic breast cancer ([place]) 01/23/2021  . Metastatic malignant neoplasm ([place]) 01/21/2021  . Palliative care patient 01/21/2021  . Malnutrition of moderate degree 01/16/2021    Palliative Care Assessment & Plan   Patient Profile:  50 y.o. 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    Recommendations/Plan: DNR DNI Goals of care discussions were again held.  Reviewed with the patient about her current condition as well as the proposed treatment plan.  Reviewed with her again about hospice philosophy of care, discussed about differences between home with hospice versus residential hospice.  Discussed about full scope of comfort measures.  Discussed about judicious use of opioids and to carefully balance awakeness and alertness with adequate pain control.  To this end, principle of double effect also explained.  Continue with liquid oxycodone for pain, continue with other pain and on pain symptom management, medication history reviewed.  Will discuss with inter disclipinary team members about whether home with hospice is possible.     Goals of Care and Additional Recommendations: Now DNR and plans being made for exploring further options with hospice.  Code Status: now DNR.     Code Status Orders  (From admission, onward)           Start     Ordered   02/05/21 1835  Full code  Continuous        02/05/21 1834           Code Status History     Date Active Date Inactive Code Status Order ID Comments User Context   01/14/2021 1822 01/21/2021 1842 Full Code [id number]  Lequita Halt, MD ED      Advance Directive Documentation    Flowsheet Row Most Recent Value  Type of Advance Directive Healthcare Power of Attorney, Living will  Pre-existing out of facility DNR order (yellow form or pink MOST form) --  "MOST" Form in Place? --  Prognosis:  Likely  2-3 weeks Discharge Planning: Possibly residential hospice versus home with hospice.   Care plan was discussed with patient, and  interdisciplinary team as mentioned above. Thank you for allowing the Palliative Medicine Team to assist in the care of this patient.   Total Time 35 Prolonged Time Billed No   Greater than 50%  of this time was spent counseling and coordinating care related to the above assessment and plan.   Loistine Chance, MD  Please contact Palliative Medicine Team phone at [phone] for questions and concerns from 7 AM to 7 PM. After 7 PM, please contact primary service.

## 2021-02-14 NOTE — Progress Notes (Signed)
Manufacturing engineer (ACC)  Hydrologist [facility]) Hospital Liaison: RN note    Notified by Transition of Care Manger of patient/family request for [facility] services at home after discharge. Chart and patient information under review by [facility] physician. Hospice eligibility pending currently.    Writer spoke with shykela to initiate education related to hospice philosophy, services and team approach to care.           verbalized understanding of information given. Per discussion, plan is for discharge to home by PTAR.     Please send signed and completed DNR form home with patient/family. Patient will need prescriptions for discharge comfort medications.     DME needs have been discussed, patient currently has the following equipment in the home: none.  Patient/family requests the following DME for delivery to the home: Hospital bed, bedside table, and WC. [facility] equipment manager has been notified and will contact DME provider to arrange delivery to the home. Home address has been verified and is correct in the chart.  Rexford Maus is the family member to contact to arrange time of delivery.     [facility] aware of the above. Please notify [facility] when patient is ready to leave the unit at discharge. (Call [phone] or [phone] after 5pm.) ACC information and contact numbers given to [facility].      Please call with any hospice related questions.     Thank you for this referral.     Clementeen Hoof, BSN, [facility] (listed on [facility] under Hospice and [place] [facility] [phone]  [phone]

## 2021-02-14 NOTE — Progress Notes (Signed)
PROGRESS NOTE  Ashley Ortega [id number] DOB: 1971/05/14 DOA: 02/05/2021 PCP: Pcp, No   LOS: 8 days   Brief Narrative / Interim history: 49 year old female with metastatic breast cancer, incomplete chemotherapy in the past (received chemo x1 and was not able to tolerate further chemo), mets to multiple bones/vertebral/liver who has been admitted for worsening metastatic disease related pain.  Subjective / 24h Interval events: Says that she wants to go home, feels like oxycodone is working  Assessment & Plan: Principal Problem Stage IV metastatic breast cancer with mets to the liver/bone causing severe pain, FTT, severe protein calorie malnutrition-appreciate palliative follow-up, currently continue fentanyl patch along with oxycodone. -Initial plans were for patient to be discharged to residential hospice however she is much more alert today, and asks to go home.  Discussed with palliative, TOC.  We will get PT to work with her and determine her strength  Active Problems HIV disease-continue Biktarvy Severe protein calorie malnutrition-dietary consulted Hypokalemia, hypophosphatemia-replete as indicated Anemia of chronic disease-stable, no bleeding Anxiety-continue as needed Xanax Intermittent fevers-possibly an infiltrate on the chest x-ray, received a course of antibiotics.  This is possibly related to her malignancy Liver mets -She was initially scheduled for biopsy by IR as outpatient but at this time, didn't want to do it.  Odynophagia -Added diflucan and nystatin.  Sinus tachycardia- Probably from fever.    Scheduled Meds:  bictegravir-emtricitabine-tenofovir AF  1 tablet Oral Daily   feeding supplement  237 mL Oral TID BM   fentaNYL  1 patch Transdermal Q72H   fluconazole  200 mg Oral Daily   HYDROmorphone  1 mg Oral Once   metoprolol tartrate  25 mg Oral BID   multivitamin with minerals  1 tablet Oral Daily   nystatin   Topical TID   phosphorus  500 mg Oral TID    polyethylene glycol  17 g Oral BID   senna  2 tablet Oral BID   Continuous Infusions: PRN Meds:.acetaminophen **OR** acetaminophen, ALPRAZolam, alum & mag hydroxide-simeth, metoprolol tartrate, ondansetron **OR** ondansetron (ZOFRAN) IV, oxyCODONE  Diet Orders (From admission, onward)     Start     Ordered   02/11/21 1307  Diet regular Room service appropriate? Yes; Fluid consistency: Thin  Diet effective now       Question Answer Comment  Room service appropriate? Yes   Fluid consistency: Thin      02/11/21 1306            DVT prophylaxis: Place and maintain sequential compression device Start: 02/07/21 1454     Code Status: DNR  Family Communication: No family at bedside  Status is: Inpatient  Remains inpatient appropriate because:Inpatient level of care appropriate due to severity of illness  Dispo: The patient is from: Home              Anticipated d/c is to:  Residential hospice              Patient currently is not medically stable to d/c.   Difficult to place patient No  Level of care: Med-Surg  Consultants:  Oncology Palliative care  Procedures:  none  Microbiology  none  Antimicrobials: none    Objective: Vitals:   02/14/21 0059 02/14/21 0230 02/14/21 0317 02/14/21 0904  BP:   107/67 97/74  Pulse:   (!) 103 96  Resp:   16 18  Temp: (!) 102.1 F (38.9 C) 98.3 F (36.8 C) 98.2 F (36.8 C)   TempSrc: Oral Oral Oral  SpO2:   98% 98%  Weight:      Height:        Intake/Output Summary (Last 24 hours) at 02/14/2021 1057 Last data filed at 02/14/2021 1029 Gross per 24 hour  Intake 720 ml  Output 300 ml  Net 420 ml    Filed Weights   02/12/21 0500  Weight: 50 kg    Examination:  Constitutional: Cachectic appearing, no apparent distress, in bed Eyes: Anicteric ENMT: mmm Neck: normal, supple Respiratory: Lungs are clear Cardiovascular: Regular rate and rhythm, no murmurs, no peripheral edema Musculoskeletal: no clubbing /  cyanosis.  Skin: No rashes seen Neurologic: Nonfocal  Data Reviewed: I have independently reviewed following labs and imaging studies   CBC: Recent Labs  Lab 02/08/21 0517 02/09/21 0603  WBC 6.8 7.3  NEUTROABS  --  5.5  HGB 9.5* 8.8*  HCT 29.2* 27.2*  MCV 85.9 85.0  PLT 426* [id number]    Basic Metabolic Panel: Recent Labs  Lab 02/08/21 0517 02/08/21 1635 02/09/21 0603 02/11/21 1158  NA 139  --  133* 136  K 3.1*  --  4.0 3.6  CL 103  --  99 100  CO2 27  --  27 27  GLUCOSE 107*  --  92 120*  BUN 7  --  <5* 6  CREATININE 0.63  --  0.37* 0.49  CALCIUM 10.0  --  9.7 9.3  MG  --  2.2  --   --   PHOS  --  1.9*  --  3.1    Liver Function Tests: No results for input(s): AST, ALT, ALKPHOS, BILITOT, PROT, ALBUMIN in the last 168 hours. Coagulation Profile: Recent Labs  Lab 02/11/21 0458  INR 1.1    HbA1C: No results for input(s): HGBA1C in the last 72 hours. CBG: No results for input(s): GLUCAP in the last 168 hours.  Recent Results (from the past 240 hour(s))  Resp Panel by RT-PCR (Flu A&B, Covid) Nasopharyngeal Swab     Status: None   Collection Time: 02/05/21 12:15 PM   Specimen: Nasopharyngeal Swab; Nasopharyngeal(NP) swabs in vial transport medium  Result Value Ref Range Status   SARS Coronavirus 2 by RT PCR NEGATIVE NEGATIVE Final    Comment: (NOTE) SARS-CoV-2 target nucleic acids are NOT DETECTED.  The SARS-CoV-2 RNA is generally detectable in upper respiratory specimens during the acute phase of infection. The lowest concentration of SARS-CoV-2 viral copies this assay can detect is 138 copies/mL. A negative result does not preclude SARS-Cov-2 infection and should not be used as the sole basis for treatment or other patient management decisions. A negative result may occur with  improper specimen collection/handling, submission of specimen other than nasopharyngeal swab, presence of viral mutation(s) within the areas targeted by this assay, and inadequate  number of viral copies(<138 copies/mL). A negative result must be combined with clinical observations, patient history, and epidemiological information. The expected result is Negative.  Fact Sheet for Patients:  EntrepreneurPulse.com.au  Fact Sheet for Healthcare Providers:  IncredibleEmployment.be  This test is no t yet approved or cleared by the [place] FDA and  has been authorized for detection and/or diagnosis of SARS-CoV-2 by FDA under an Emergency Use Authorization (EUA). This EUA will remain  in effect (meaning this test can be used) for the duration of the COVID-19 declaration under Section 564(b)(1) of the Act, 21 U.S.C.section 360bbb-3(b)(1), unless the authorization is terminated  or revoked sooner.       Influenza A by PCR NEGATIVE NEGATIVE  Final   Influenza B by PCR NEGATIVE NEGATIVE Final    Comment: (NOTE) The Xpert Xpress SARS-CoV-2/FLU/RSV plus assay is intended as an aid in the diagnosis of influenza from Nasopharyngeal swab specimens and should not be used as a sole basis for treatment. Nasal washings and aspirates are unacceptable for Xpert Xpress SARS-CoV-2/FLU/RSV testing.  Fact Sheet for Patients: EntrepreneurPulse.com.au  Fact Sheet for Healthcare Providers: IncredibleEmployment.be  This test is not yet approved or cleared by the [place] FDA and has been authorized for detection and/or diagnosis of SARS-CoV-2 by FDA under an Emergency Use Authorization (EUA). This EUA will remain in effect (meaning this test can be used) for the duration of the COVID-19 declaration under Section 564(b)(1) of the Act, 21 U.S.C. section 360bbb-3(b)(1), unless the authorization is terminated or revoked.  Performed at [facility], [place] [address], [place], [place] [address]   Culture, blood (Routine X 2) w Reflex to ID Panel     Status: None   Collection Time:  02/08/21  4:35 PM   Specimen: BLOOD  Result Value Ref Range Status   Specimen Description   Final    BLOOD LEFT ANTECUBITAL Performed at [place] [address], [place], [place] [address]    Special Requests   Final    BOTTLES DRAWN AEROBIC AND ANAEROBIC Blood Culture adequate volume Performed at [place] [address], [place], [place] [address]    Culture   Final    NO GROWTH 5 DAYS Performed at [facility], [place] [address] [place], [place] [address]    Report Status 02/13/2021 FINAL  Final  Culture, blood (Routine X 2) w Reflex to ID Panel     Status: None   Collection Time: 02/08/21  4:35 PM   Specimen: BLOOD  Result Value Ref Range Status   Specimen Description   Final    BLOOD LEFT ANTECUBITAL Performed at [place] [address] [place], [place] [address]    Special Requests   Final    BOTTLES DRAWN AEROBIC AND ANAEROBIC Blood Culture results may not be optimal due to an excessive volume of blood received in culture bottles Performed at [place] [address] [place], [place] [address]    Culture   Final    NO GROWTH 5 DAYS Performed at [facility], [place] [address], [place], [place] [address]    Report Status 02/13/2021 FINAL  Final      Radiology Studies: No results found.  Marzetta Board, MD, PhD [facility]  Between 7 am - 7 pm I am available, please contact me via Amion (for emergencies) or Securechat (non urgent messages)  Between 7 pm - 7 am I am not available, please contact night coverage MD/APP via Amion

## 2021-02-14 NOTE — Evaluation (Addendum)
Physical Therapy Evaluation Patient Details Name: Ashley Ortega MRN: [id number] DOB: 05/10/1971 Today's Date: 02/14/2021  History of Present Illness  50 y.o. 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    Clinical Impression  Ashley Ortega is 50 y.o. female admitted with above HPI and diagnosis. Patient is currently limited by functional impairments below (see PT problem list). Patient lives with her sister and daugther and is mod independent with RW PTA. Patient currently requires min guard/assist for all mobility and will benefit from continued skilled PT interventions to address impairments and progress independence with mobility, recommending pt return home with 24/7 assist from family. Pt reports plan is for hospice services at this time. Acute PT will follow and progress as able.        Recommendations for follow up therapy are one component of a multi-disciplinary discharge planning process, led by the attending physician.  Recommendations may be updated based on patient status, additional functional criteria and insurance authorization.  Follow Up Recommendations Home health PT;Supervision/Assistance - 24 hour    Equipment Recommendations  Rolling walker with 5" wheels    Recommendations for Other Services       Precautions / Restrictions Precautions Precautions: Fall Restrictions Weight Bearing Restrictions: No      Mobility  Bed Mobility               General bed mobility comments: pt OOB on BSC at start and end of session.    Transfers Overall transfer level: Needs assistance Equipment used: Rolling walker (2 wheeled) Transfers: Sit to/from Stand Sit to Stand: Min guard         General transfer comment:  gaurding for safety with rise from [facility], no assist needed, pt using bil UE for power up.  Ambulation/Gait Ambulation/Gait assistance: Min assist;Min guard Gait Distance (Feet): 200 Feet Assistive device: Rolling walker (2 wheeled) Gait Pattern/deviations: Step-through pattern;Decreased stride length;Trunk flexed Gait velocity: decr   General Gait Details: pt maintained safe position to RW, gait overall slow and cautious. tep pattern with limited hip/knee flexion and shuffling steps at time. pt taking standing rest break halfway through. No overt LOB throughout.  Stairs            Wheelchair Mobility    Modified Rankin (Stroke Patients Only)       Balance Overall balance assessment: Needs assistance Sitting-balance support: No upper extremity supported;Feet supported Sitting balance-Leahy Scale: Fair     Standing balance support: Bilateral upper extremity supported Standing balance-Leahy Scale: Poor Standing balance comment: reliant on UEs                             Pertinent Vitals/Pain Pain Assessment: Faces Faces Pain Scale: Hurts little more Pain Location: genralized discomfort and Rt LE pain Pain Descriptors / Indicators: Aching;Sore Pain Intervention(s): Limited activity within patient's tolerance;Monitored during session;Repositioned    Home Living Family/patient expects to be discharged to:: Private residence Living Arrangements: Other relatives Available Help at Discharge: Family;Available 24 hours/day Type of Home: Apartment Home Access: Stairs to enter Entrance Stairs-Rails: Left Entrance Stairs-Number of Steps: 10-landing-10-landing-10-level where she lives (per pt plan is to d/c to her apt) Home Layout: One level Home Equipment: Bedside commode;Shower seat;Walker - 2 wheels;Cane - single  point      Prior Function Level of Independence: Independent with assistive device(s)         Comments: pt reports use of RW for mobility due to  pain and weakness     Hand Dominance   Dominant Hand: Right    Extremity/Trunk Assessment   Upper Extremity Assessment Upper Extremity Assessment: Overall WFL for tasks assessed    Lower Extremity Assessment Lower Extremity Assessment: Generalized weakness RLE Deficits / Details: grossly 3+/5 LLE Deficits / Details: 2+ to 3/5    Cervical / Trunk Assessment Cervical / Trunk Assessment: Kyphotic  Communication   Communication: No difficulties  Cognition Arousal/Alertness: Awake/alert Behavior During Therapy: WFL for tasks assessed/performed Overall Cognitive Status: Within Functional Limits for tasks assessed                                        General Comments      Exercises     Assessment/Plan    PT Assessment    PT Problem List         PT Treatment Interventions      PT Goals (Current goals can be found in the Care Plan section)  Acute Rehab PT Goals PT Goal Formulation: With patient Time For Goal Achievement: 02/28/21 Potential to Achieve Goals: Good    Frequency Min 3X/week   Barriers to discharge        Co-evaluation               AM-PAC PT "6 Clicks" Mobility  Outcome Measure Help needed turning from your back to your side while in a flat bed without using bedrails?: A Little Help needed moving from lying on your back to sitting on the side of a flat bed without using bedrails?: A Little Help needed moving to and from a bed to a chair (including a wheelchair)?: A Little Help needed standing up from a chair using your arms (e.g., wheelchair or bedside chair)?: A Little Help needed to walk in hospital room?: A Little Help needed climbing 3-5 steps with a railing? : A Lot 6 Click Score: 17    End of Session Equipment Utilized During Treatment: Gait belt Activity Tolerance: Patient tolerated treatment well Patient left: Other (comment);with family/visitor present;with nursing/sitter in room Nurse Communication: Mobility  status PT Visit Diagnosis: Other abnormalities of gait and mobility (R26.89);Muscle weakness (generalized) (M62.81)     02/14/21 1300  PT Time Calculation  PT Start Time (ACUTE ONLY) 1033  PT Stop Time (ACUTE ONLY) 1106  PT Time Calculation (min) (ACUTE ONLY) 33 min  PT General Charges  $$ ACUTE PT VISIT 1 Visit  PT Evaluation  $PT Eval Low Complexity 1 Low  PT Treatments  $Gait Training 8-22 mins             Verner Mould, DPT Acute Rehabilitation Services Office [phone] Pager [phone]   Jacques Navy 02/14/2021, 4:57 PM

## 2021-02-14 NOTE — TOC Initial Note (Signed)
Transition of Care [facility]) - Initial/Assessment Note    Patient Details  Name: Ashley Ortega MRN: [id number] Date of Birth: 1970/06/27  [facility] CM/SW Contact:    Lynnell Catalan, RN Phone Number: 02/14/2021, 12:52 PM  Clinical Narrative:                 [facility] consult for home hospice services. Met with pt at the bedside for dc planning. Pt would like to go home with her sister and home hospice services. Choice offered and [facility] chosen. Pt is requesting hospital bed, over bed table and wheelchair. Hospice liaison contacted for referral and DME needs. Hospice liaison to contact pt sister for delivery of DME today so pt can hopefully go home tomorrow. Pt will need ambulance transport home as she lives on the 3rd floor.  Expected Discharge Plan: Home w Hospice Care Barriers to Discharge: No Barriers Identified   Patient Goals and CMS Choice Patient states their goals for this hospitalization and ongoing recovery are:: To get home      Expected Discharge Plan and Services Expected Discharge Plan: [place]   Discharge Planning Services: CM Consult   Living arrangements for the past 2 months: Apartment Expected Discharge Date:  (unknown)                         HH Arranged: Disease Management [place] Agency: Hospice and [place] Date [facility] Agency Contacted: 02/14/21 Time HH Agency Contacted: 1250 Representative spoke with at [place]: [facility]  Prior Living Arrangements/Services Living arrangements for the past 2 months: [place] with:: Siblings Patient language and need for interpreter reviewed:: Yes Do you feel safe going back to the place where you live?: Yes      Need for Family Participation in Patient Care: Yes (Comment) Care giver support system in place?: Yes (comment)   Criminal Activity/Legal Involvement Pertinent to Current Situation/Hospitalization: No - Comment as needed  Activities of Daily Living Home  Assistive Devices/Equipment: Bedside commode/3-in-1, Shower chair without back ADL Screening (condition at time of admission) Patient's cognitive ability adequate to safely complete daily activities?: Yes Is the patient deaf or have difficulty hearing?: No Does the patient have difficulty seeing, even when wearing glasses/contacts?: No Does the patient have difficulty concentrating, remembering, or making decisions?: No Patient able to express need for assistance with ADLs?: Yes Does the patient have difficulty dressing or bathing?: Yes Independently performs ADLs?: No (secondary to increasing shortness of breath) Communication: Independent Dressing (OT): Needs assistance Is this a change from baseline?: Change from baseline, expected to last >3 days Grooming: Independent Feeding: Independent Bathing: Needs assistance Is this a change from baseline?: Change from baseline, expected to last >3 days Toileting: Needs assistance Is this a change from baseline?: Change from baseline, expected to last >3days In/Out Bed: Needs assistance Is this a change from baseline?: Change from baseline, expected to last >3 days Walks in Home: Needs assistance Is this a change from baseline?: Change from baseline, expected to last >3 days Does the patient have difficulty walking or climbing stairs?: Yes (secondary to shortness of breath) Weakness of Legs: Both Weakness of Arms/Hands: None  Permission Sought/Granted Permission sought to share information with : Facility Art therapist granted to share information with : Yes, Verbal Permission Granted     Permission granted to share info w AGENCY: [facility]        Emotional Assessment Appearance:: Appears older than stated  age Attitude/Demeanor/Rapport: Gracious Affect (typically observed): Calm Orientation: : Oriented to Self, Oriented to Place, Oriented to  Time, Oriented to Situation Alcohol / Substance Use: Not  Applicable Psych Involvement: No (comment)  Admission diagnosis:  Metastatic cancer [facility]) [C79.9] Patient Active Problem List   Diagnosis Date Noted   Metastatic cancer ([place]) 02/05/2021   Cancer related pain 02/05/2021   Constipation 02/05/2021   HIV (human immunodeficiency virus infection) ([place]) 02/05/2021   Metastatic breast cancer ([place]) 01/23/2021   Metastatic malignant neoplasm ([place]) 01/21/2021   Palliative care patient 01/21/2021   Malnutrition of moderate degree 01/16/2021   PCP:  Pcp, No Pharmacy:   [id number] - [place], [place] [address] [address] Phone: [phone] Fax: [phone]  [facility] [address] [place], [place] [facility] [place] [address] [place] [address] Phone: [phone] Fax: [phone]     Social Determinants of Health ([place]) Interventions    Readmission Risk Interventions Readmission Risk Prevention Plan 02/14/2021  Transportation Screening Complete  PCP or Specialist Appt within 5-7 Days Complete  Home Care Screening Complete  Medication Review (RN CM) Complete

## 2021-02-15 MED ORDER — ALPRAZOLAM 0.5 MG PO TABS: 0.5000 mg | ORAL_TABLET | Freq: Three times a day (TID) | ORAL | 0 refills | Status: AC | PRN

## 2021-02-15 MED ORDER — OXYCODONE HCL 5 MG/5ML PO SOLN: 15.0000 mg | ORAL | 0 refills | Status: AC | PRN

## 2021-02-15 MED ORDER — METOPROLOL TARTRATE 25 MG PO TABS: 25.0000 mg | ORAL_TABLET | Freq: Two times a day (BID) | ORAL | 0 refills | Status: AC

## 2021-02-15 MED ORDER — FENTANYL 50 MCG/HR TD PT72: 3.0000 | MEDICATED_PATCH | TRANSDERMAL | 0 refills | Status: AC

## 2021-02-15 MED ORDER — OXYCODONE HCL 5 MG/5ML PO SOLN
15.0000 mg | ORAL | Status: DC | PRN
  Administered 2021-02-15 (×3): 15 mg via ORAL
  Filled 2021-02-15 (×3): qty 15

## 2021-02-15 NOTE — Progress Notes (Signed)
Patient picked up by [facility] (transport) to be transported home.

## 2021-02-15 NOTE — TOC Transition Note (Signed)
Transition of Care [facility]) - CM/SW Discharge Note   Patient Details  Name: Ashley Ortega MRN: [id number] Date of Birth: 1971/04/16  Transition of Care [facility]) CM/SW Contact:  Lynnell Catalan, RN Phone Number: 02/15/2021, 12:21 PM   Clinical Narrative:     Pt to dc home via [facility]. Sister is at the home. Yellow DNR on the chart for transport.  Final next level of care: Home w Hospice Care Barriers to Discharge: No Barriers Identified   Patient Goals and CMS Choice Patient states their goals for this hospitalization and ongoing recovery are:: To get home   Discharge Plan and Services   Discharge Planning Services: CM Consult                      HH Arranged: Disease Management [place] Agency: Hospice and [place] Date [place]: 02/14/21 Time [place]: 1250 Representative spoke with at [place]: [place] ([place]) Interventions     Readmission Risk Interventions Readmission Risk Prevention Plan 02/14/2021  Transportation Screening Complete  PCP or Specialist Appt within 5-7 Days Complete  Home Care Screening Complete  Medication Review (RN CM) Complete

## 2021-02-15 NOTE — Progress Notes (Signed)
Patient crying this early morning due to back pain and pelvic pain. Also complaint of anxiety due to pain. Medicated with pain medicine and anti-anxiety medicine.

## 2021-02-15 NOTE — Discharge Summary (Signed)
Physician Discharge Summary  Ashley Ortega [id number] DOB: 1970-11-18 DOA: 02/05/2021  PCP: Merryl Hacker, No  Admit date: 02/05/2021 Discharge date: 02/15/2021  Admitted From: home Disposition:  home with hospice  Recommendations for Outpatient Follow-up:  Follow up with hospice  Home Health: hospice Equipment/Devices: none  Discharge Condition: stable CODE STATUS: DNR Diet recommendation: comfort feeding, as tolerated  HPI: Per admitting MD, Ashley Ortega is a 50 y.o. female with medical history significant of metastatic breast cancer, incomplete chemotherapies in the past, metastasis to multiple bones/ vertebrae and liver who was here today for liver biopsy, found to have too much pain and discomfort so sent to emergency room.  Patient was recently in the hospital with similar complaints.  She was treated and started on pain medication regimen and discharged home.  Apparently, she had been following up with Dr. Lindi Adie and is on increasing dose of fentanyl however symptoms not controlled. Patient has metastatic breast cancer diagnosed in 2020.  She had received 1 dose of chemotherapy and did not tolerate since then.  Patient also has HIV and currently on [place].  Recently found to have diffuse metastatic lesion and was planned for liver biopsy.  Today, she went to interventional radiology.  She felt too weak and in too much pain even to get out of the wheelchair.  Complained of chest discomfort and difficulty breathing.  Really not eating well and feels dehydrated.  Biopsy was canceled and sent to ER. Patient complains of poor appetite chronically but not eating anything for last few days. She also has constipation, taking large dose of laxatives.  Last bowel movement today morning that was a small amount and incomplete.  Has chronic nausea.  She has been trying to drink water but not eating any food as she is scared to eat anything to avoid constipation.  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 Initial plans were for patient to be discharged to residential hospice however she remains alert, asking to go home.  Discussed with palliative, TOC.  PT worked with the patient and she is ambulatory. She was set up with home hospice and will be discharged in stable condition.   Active Problems HIV disease-continue Biktarvy Severe protein calorie malnutrition-dietary consulted Hypokalemia, hypophosphatemia-replete as indicated Anemia of chronic disease-stable, no bleeding Anxiety-continue as needed Xanax Nocturnal fevers-possibly an infiltrate on the chest x-ray, received a course of antibiotics.  This more likely related to her malignancy  Liver mets -She was initially scheduled for biopsy by IR as outpatient but at this time, didn't want to do it.  Odynophagia -improved after a course of diflucan and nystatin.  Sinus tachycardia- due to malignancy burden  Sepsis ruled out   Discharge Instructions   Allergies as of 02/15/2021   No Known Allergies      Medication List     STOP taking these medications    fentaNYL 25 MCG/HR Commonly known as: DURAGESIC Replaced by: fentaNYL 50 MCG/HR   LORazepam 0.5 MG tablet Commonly known as: ATIVAN   Oxycodone HCl 10 MG Tabs Replaced by: oxyCODONE 5 MG/5ML solution       TAKE these medications    ALPRAZolam 0.5 MG tablet Commonly known as: XANAX Take 1 tablet (0.5 mg total) by mouth 3 (three) times daily as needed for anxiety.   Biktarvy 50-200-25 MG Tabs tablet Generic drug:  bictegravir-emtricitabine-tenofovir AF Take 1 tablet by mouth daily.   bisacodyl 10 MG suppository Commonly known as: DULCOLAX Place 1 suppository (10 mg total) rectally daily as needed for moderate constipation.    fentaNYL 50 MCG/HR Commonly known as: [place] 3 patches onto the skin every 3 (three) days. Start taking on: February 17, 2021 Replaces: fentaNYL 25 MCG/HR   metoprolol tartrate 25 MG tablet Commonly known as: LOPRESSOR Take 1 tablet (25 mg total) by mouth 2 (two) times daily.   oxyCODONE 5 MG/5ML solution Commonly known as: ROXICODONE Take 15 mLs (15 mg total) by mouth every 3 (three) hours as needed for up to 5 days for severe pain. Replaces: Oxycodone HCl 10 MG Tabs   polyethylene glycol 17 g packet Commonly known as: MIRALAX / GLYCOLAX Take 17 g by mouth 2 (two) times daily.        Follow-up Information     [facility] Follow up.   Why: A scheduler from our office will call you to reschedule liver lesion biopsy after you are discharged from the hospital. Contact information: [place] [address] [id number]                 Consultations: Oncology  Palliative care  Procedures/Studies:  DG Chest 2 View  Result Date: 02/08/2021 CLINICAL DATA:  Fever EXAM: CHEST - 2 VIEW COMPARISON:  02/05/2021 FINDINGS: Possible tiny pleural effusions on lateral view. No focal opacity or pneumothorax. Normal cardiac size. IMPRESSION: Possible trace pleural effusions.  No focal airspace disease. Electronically Signed   By: Donavan Foil M.D.   On: 02/08/2021 19:42   DG Chest 2 View  Result Date: 02/05/2021 CLINICAL DATA:  Shortness of breath EXAM: CHEST - 2 VIEW COMPARISON:  01/14/2021 FINDINGS: No new consolidation or edema. No pleural effusion or pneumothorax. Stable cardiomediastinal contours with normal heart size. No acute osseous abnormality. IMPRESSION: No acute process in the chest. Electronically Signed   By: Macy Mis M.D.   On: 02/05/2021 12:29   CT ANGIO CHEST PE W OR WO CONTRAST  Result Date: 02/05/2021 CLINICAL DATA:  50 year old female with history of shortness of breath since this morning. History of  metastatic breast cancer. Evaluate for pulmonary embolism. EXAM: CT ANGIOGRAPHY CHEST WITH CONTRAST TECHNIQUE: Multidetector CT imaging of the chest was performed using the standard protocol during bolus administration of intravenous contrast. Multiplanar CT image reconstructions and MIPs were obtained to evaluate the vascular anatomy. CONTRAST:  68m OMNIPAQUE IOHEXOL 350 MG/ML SOLN COMPARISON:  Chest CTA 01/14/2021. FINDINGS: Cardiovascular: No filling defects in the pulmonary arterial tree to suggest pulmonary embolism. Heart size is normal. There is no significant pericardial fluid, thickening or pericardial calcification. No atherosclerotic calcifications in the thoracic aorta or the coronary arteries. Mediastinum/Nodes: No pathologically enlarged mediastinal, hilar or internal mammary lymph nodes. Esophagus is unremarkable in appearance. No axillary lymphadenopathy. Lungs/Pleura: New area of subsegmental atelectasis in the left lower lobe. No acute consolidative airspace disease. No pleural effusions. No suspicious appearing pulmonary nodules or masses are noted. Upper Abdomen: Unremarkable. Musculoskeletal: Widespread lytic lesions are again noted throughout the visualized axial and appendicular skeleton, indicative of widespread metastatic disease to the bones. These appear relatively similar to the prior study, although there is increasing pathologic compression related to the lesion in the L1 vertebral body where there is up to 70% loss of height in the lateral aspect of the vertebral body on the right side where there is a large infiltrative mass extending  into the transverse process and narrowing the spinal canal, measuring up to 6.4 x 3.8 cm (axial image 119 of series 5). There is also been some further compression at T12, now with up to 50% loss of anterior vertebral body height. Review of the MIP images confirms the above findings. IMPRESSION: 1. No evidence of pulmonary embolism. 2. Widespread  metastatic disease to the bones, with increasing pathologic compression fractures at T12 and L1, as above. 3. Small amount of subsegmental atelectasis in the left lower lobe is new compared to the prior study. Electronically Signed   By: Vinnie Langton M.D.   On: 02/05/2021 15:00   DG ABD ACUTE 2+V W 1V CHEST  Result Date: 01/18/2021 CLINICAL DATA:  Abdominal distension, abdominal pain. Breast cancer. EXAM: DG ABDOMEN ACUTE WITH 1 VIEW CHEST COMPARISON:  12/23/2020, 01/14/2021 FINDINGS: There is no evidence of dilated bowel loops or free intraperitoneal air. No radiopaque calculi or other significant radiographic abnormality is seen. Heart size and mediastinal contours are within normal limits. Both lungs are clear. Numerous lytic lesions throughout the axial and appendicular skeleton including large lesions involving the L1 and L4 vertebral bodies. IMPRESSION: 1. Negative abdominal radiographs. No acute cardiopulmonary disease. 2. Widespread osseous metastatic disease, as seen on recent CT examinations. Electronically Signed   By: Davina Poke D.O.   On: 01/18/2021 13:43   DG HIPS BILAT WITH PELVIS 2V  Result Date: 02/08/2021 CLINICAL DATA:  Anterior pelvic pain for the past month. History of metastatic breast cancer. EXAM: DG HIP (WITH OR WITHOUT PELVIS) 2V BILAT COMPARISON:  CT abdomen pelvis dated December 23, 2020. FINDINGS: No acute fracture or dislocation. Lytic lesions involving the right lesser tuberosity, left ischial tuberosity, left inferior pubic ramus, and bilateral sacral ala, better evaluated on most recent CT. Hip joint spaces are preserved. Soft tissues are unremarkable. IMPRESSION: 1. No acute osseous abnormality. 2. Multifocal lytic lesions, consistent with known metastatic disease, better evaluated on most recent CT. Electronically Signed   By: Titus Dubin M.D.   On: 02/08/2021 19:44     Subjective: - no chest pain, shortness of breath, no abdominal pain, nausea or vomiting.    Discharge Exam: BP 123/80 (BP Location: Left Arm)   Pulse 97   Temp 98.1 F (36.7 C) (Oral)   Resp 18   Ht '5\' 6"'$  (1.676 m)   Wt 50 kg   SpO2 100%   BMI 17.79 kg/m   General: Pt is alert, awake, not in acute distress Cardiovascular: RRR, S1/S2 +, no rubs, no gallops Respiratory: CTA bilaterally, no wheezing, no rhonchi Abdominal: Soft, NT, ND, bowel sounds + Extremities: no edema, no cyanosis    The results of significant diagnostics from this hospitalization (including imaging, microbiology, ancillary and laboratory) are listed below for reference.     Microbiology: Recent Results (from the past 240 hour(s))  Resp Panel by RT-PCR (Flu A&B, Covid) Nasopharyngeal Swab     Status: None   Collection Time: 02/05/21 12:15 PM   Specimen: Nasopharyngeal Swab; Nasopharyngeal(NP) swabs in vial transport medium  Result Value Ref Range Status   SARS Coronavirus 2 by RT PCR NEGATIVE NEGATIVE Final    Comment: (NOTE) SARS-CoV-2 target nucleic acids are NOT DETECTED.  The SARS-CoV-2 RNA is generally detectable in upper respiratory specimens during the acute phase of infection. The lowest concentration of SARS-CoV-2 viral copies this assay can detect is 138 copies/mL. A negative result does not preclude SARS-Cov-2 infection and should not be used as the sole basis  for treatment or other patient management decisions. A negative result may occur with  improper specimen collection/handling, submission of specimen other than nasopharyngeal swab, presence of viral mutation(s) within the areas targeted by this assay, and inadequate number of viral copies(<138 copies/mL). A negative result must be combined with clinical observations, patient history, and epidemiological information. The expected result is Negative.  Fact Sheet for Patients:  EntrepreneurPulse.com.au  Fact Sheet for Healthcare Providers:  IncredibleEmployment.be  This test is no t  yet approved or cleared by the [place] FDA and  has been authorized for detection and/or diagnosis of SARS-CoV-2 by FDA under an Emergency Use Authorization (EUA). This EUA will remain  in effect (meaning this test can be used) for the duration of the COVID-19 declaration under Section 564(b)(1) of the Act, 21 U.S.C.section 360bbb-3(b)(1), unless the authorization is terminated  or revoked sooner.       Influenza A by PCR NEGATIVE NEGATIVE Final   Influenza B by PCR NEGATIVE NEGATIVE Final    Comment: (NOTE) The Xpert Xpress SARS-CoV-2/FLU/RSV plus assay is intended as an aid in the diagnosis of influenza from Nasopharyngeal swab specimens and should not be used as a sole basis for treatment. Nasal washings and aspirates are unacceptable for Xpert Xpress SARS-CoV-2/FLU/RSV testing.  Fact Sheet for Patients: EntrepreneurPulse.com.au  Fact Sheet for Healthcare Providers: IncredibleEmployment.be  This test is not yet approved or cleared by the [place] FDA and has been authorized for detection and/or diagnosis of SARS-CoV-2 by FDA under an Emergency Use Authorization (EUA). This EUA will remain in effect (meaning this test can be used) for the duration of the COVID-19 declaration under Section 564(b)(1) of the Act, 21 U.S.C. section 360bbb-3(b)(1), unless the authorization is terminated or revoked.  Performed at [facility], [place] [address] [place], [place] [address]   Culture, blood (Routine X 2) w Reflex to ID Panel     Status: None   Collection Time: 02/08/21  4:35 PM   Specimen: BLOOD  Result Value Ref Range Status   Specimen Description   Final    BLOOD LEFT ANTECUBITAL Performed at [facility] [address], [place], [place] [address]    Special Requests   Final    BOTTLES DRAWN AEROBIC AND ANAEROBIC Blood Culture adequate volume Performed at [facility]  [address], [place], [place] [address]    Culture   Final    NO GROWTH 5 DAYS Performed at [facility], [place] [address], [place], [place] [address]    Report Status 02/13/2021 FINAL  Final  Culture, blood (Routine X 2) w Reflex to ID Panel     Status: None   Collection Time: 02/08/21  4:35 PM   Specimen: BLOOD  Result Value Ref Range Status   Specimen Description   Final    BLOOD LEFT ANTECUBITAL Performed at [facility] [address], [place], [place] [address]    Special Requests   Final    BOTTLES DRAWN AEROBIC AND ANAEROBIC Blood Culture results may not be optimal due to an excessive volume of blood received in culture bottles Performed at [facility] [address], [place], [place] [address]    Culture   Final    NO GROWTH 5 DAYS Performed at [facility], [place] [address] [place], [place] [address]    Report Status 02/13/2021 FINAL  Final     Labs: Basic Metabolic Panel: Recent Labs  Lab 02/08/21 1635 02/09/21 0603 02/11/21 1158  NA  --  133* 136  K  --  4.0 3.6  CL  --  99 100  CO2  --  27 27  GLUCOSE  --  92 120*  BUN  --  <5* 6  CREATININE  --  0.37* 0.49  CALCIUM  --  9.7 9.3  MG 2.2  --   --   PHOS 1.9*  --  3.1   Liver Function Tests: No results for input(s): AST, ALT, ALKPHOS, BILITOT, PROT, ALBUMIN in the last 168 hours. CBC: Recent Labs  Lab 02/09/21 0603  WBC 7.3  NEUTROABS 5.5  HGB 8.8*  HCT 27.2*  MCV 85.0  PLT 267   CBG: No results for input(s): GLUCAP in the last 168 hours. Hgb A1c No results for input(s): HGBA1C in the last 72 hours. Lipid Profile No results for input(s): CHOL, HDL, LDLCALC, TRIG, CHOLHDL, LDLDIRECT in the last 72 hours. Thyroid function studies No results for input(s): TSH, T4TOTAL, T3FREE, THYROIDAB in the last 72 hours.  Invalid input(s): FREET3 Urinalysis    Component Value Date/Time   COLORURINE YELLOW 02/08/2021 [facility] 02/08/2021  1420   LABSPEC 1.010 02/08/2021 1420   PHURINE 7.0 02/08/2021 1420   GLUCOSEU NEGATIVE 02/08/2021 1420   HGBUR NEGATIVE 02/08/2021 1420   BILIRUBINUR NEGATIVE 02/08/2021 1420   KETONESUR NEGATIVE 02/08/2021 1420   PROTEINUR NEGATIVE 02/08/2021 1420   NITRITE NEGATIVE 02/08/2021 1420   LEUKOCYTESUR NEGATIVE 02/08/2021 1420    FURTHER DISCHARGE INSTRUCTIONS:   Get Medicines reviewed and adjusted: Please take all your medications with you for your next visit with your Primary MD   Laboratory/radiological data: Please request your Primary MD to go over all hospital tests and procedure/radiological results at the follow up, please ask your Primary MD to get all Hospital records sent to his/her office.   In some cases, they will be blood work, cultures and biopsy results pending at the time of your discharge. Please request that your primary care M.D. goes through all the records of your hospital data and follows up on these results.   Also Note the following: If you experience worsening of your admission symptoms, develop shortness of breath, life threatening emergency, suicidal or homicidal thoughts you must seek medical attention immediately by calling 911 or calling your MD immediately  if symptoms less severe.   You must read complete instructions/literature along with all the possible adverse reactions/side effects for all the Medicines you take and that have been prescribed to you. Take any new Medicines after you have completely understood and accpet all the possible adverse reactions/side effects.    Do not drive when taking Pain medications or sleeping medications (Benzodaizepines)   Do not take more than prescribed Pain, Sleep and Anxiety Medications. It is not advisable to combine anxiety,sleep and pain medications without talking with your primary care practitioner   Special Instructions: If you have smoked or chewed Tobacco  in the last 2 yrs please stop smoking, stop any  regular Alcohol  and or any Recreational drug use.   Wear Seat belts while driving.   Please note: You were cared for by a hospitalist during your hospital stay. Once you are discharged, your primary care physician will handle any further medical issues. Please note that NO REFILLS for any discharge medications will be authorized once you are discharged, as it is imperative that you return to your primary care physician (or establish a relationship  with a primary care physician if you do not have one) for your post hospital discharge needs so that they can reassess your need for medications and monitor your lab values.  Time coordinating discharge: 40 minutes  SIGNED:  Marzetta Board, MD, PhD 02/15/2021, 9:31 AM

## 2021-02-15 NOTE — Progress Notes (Signed)
Patient up in the chair, frustrated because she has been waiting for the PTAR (transport)to be picked up to go home.

## 2021-02-15 NOTE — Progress Notes (Signed)
[facility] [address] [facility]) Hospital Liaison RN note  Hospice eligibility is confirmed for Ms. Doble. DME has been delivered to the home as verified with patient's sister.  Ambulance transport is needed for discharge.   Please send signed and completed DNR home with patient/family.  Please provide prescriptions at discharge to ensure ongoing symptom management.  Please call with any hospice related questions or concerns.  Thank you, Margaretmary Eddy, BSN [facility] Liaison [phone]

## 2023-02-06 IMAGING — CT CT ABD-PELV W/ CM
2 of 4 series · 15 of 46 positions shown, 17 images · IV contrast (omnipaque)
Comparison: 08/29/2020

CLINICAL DATA: Acute nonlocalized abdominal pain. Left-sided flank
and back pain. Stage IV breast cancer.

EXAM:
CT ANGIOGRAPHY CHEST
CT ABDOMEN AND PELVIS WITH CONTRAST
TECHNIQUE: Multidetector CT imaging of the chest was performed using the
standard protocol during bolus administration of intravenous
contrast. Multiplanar CT image reconstructions and MIPs were
obtained to evaluate the vascular anatomy. Multidetector CT imaging
of the abdomen and pelvis was performed using the standard protocol
during bolus administration of intravenous contrast.
CONTRAST:  80mL OMNIPAQUE IOHEXOL 350 MG/ML SOLN

[Series 2: axial st · axial · 0.67mm/px · z∈[-573,-203]mm · 12 of 82 slices shown, 14 images]
[im 4/82  soft-tissue]
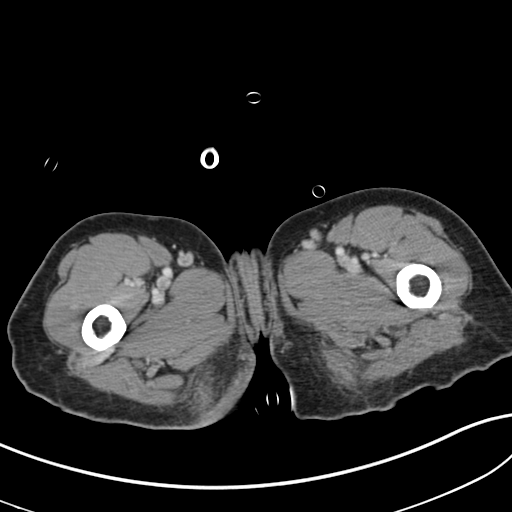
[im 4/82  bone]
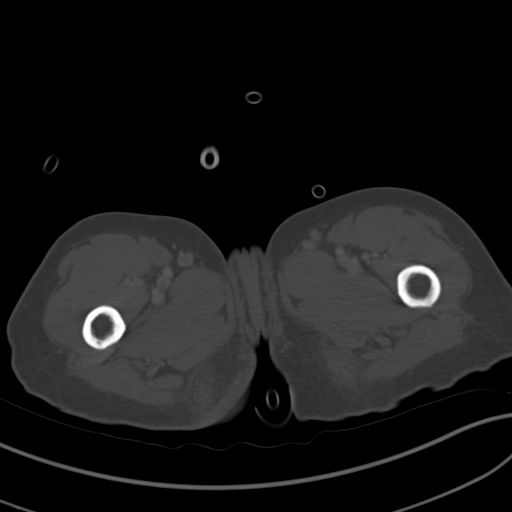
[im 12/82  soft-tissue]
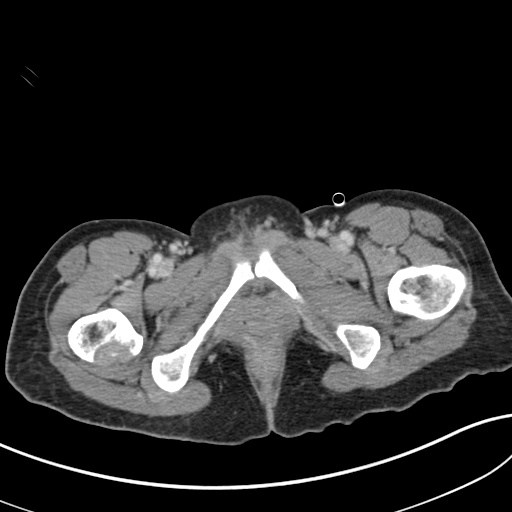
[im 20/82  soft-tissue]
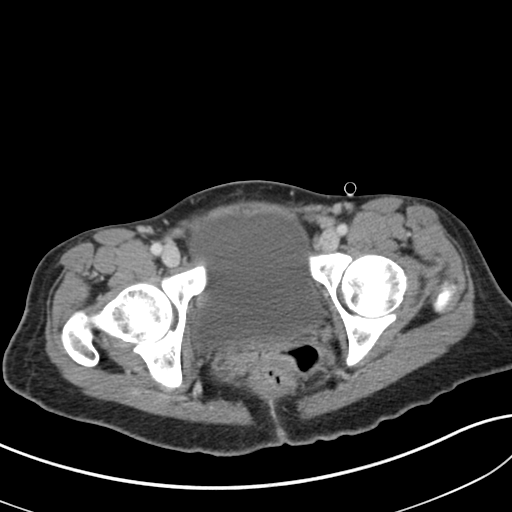
[im 24/82  soft-tissue]
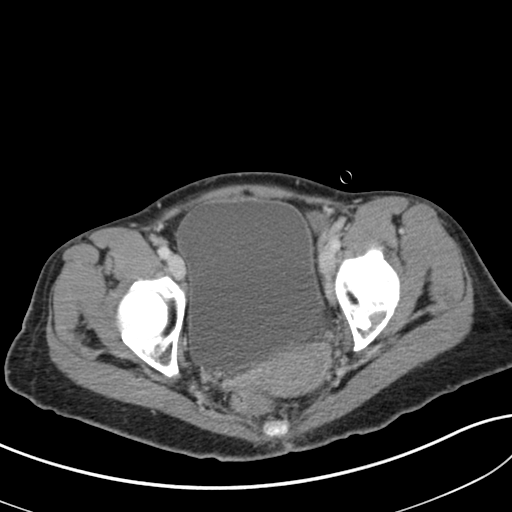
[im 31/82  soft-tissue]
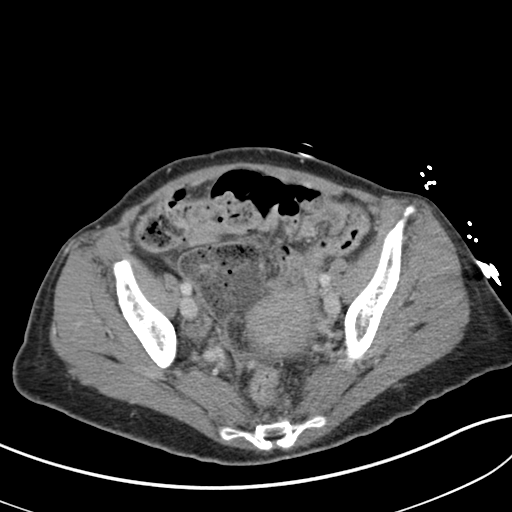
[im 39/82  soft-tissue]
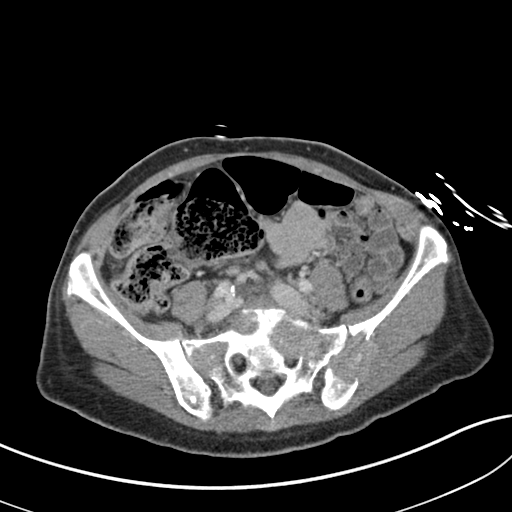
[im 43/82  soft-tissue]
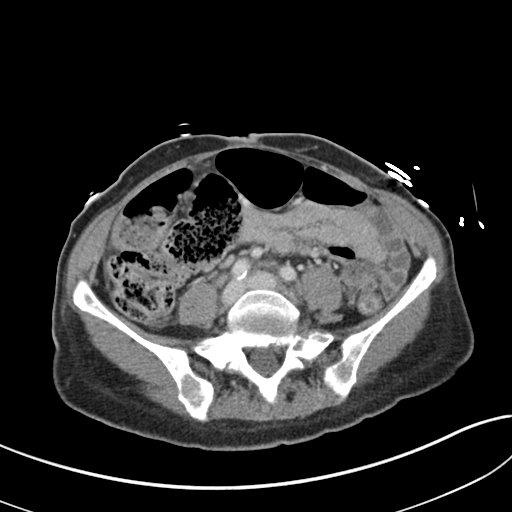
[im 51/82  soft-tissue]
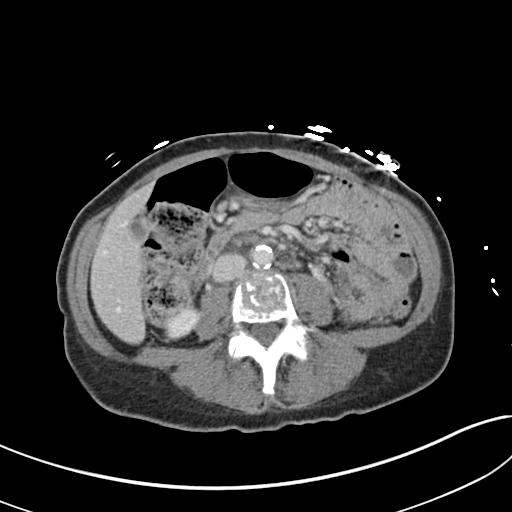
[im 58/82  soft-tissue]
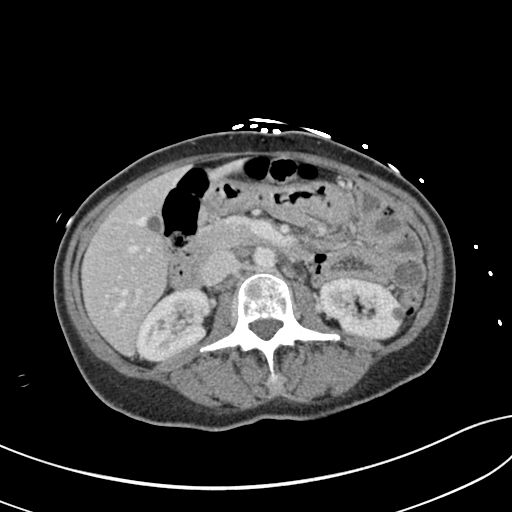
[im 58/82  bone]
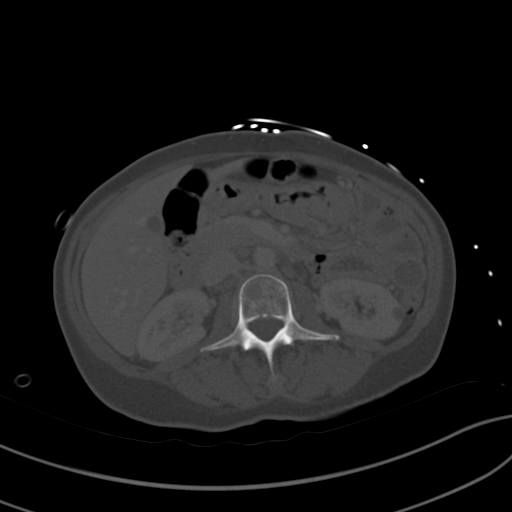
[im 62/82  soft-tissue]
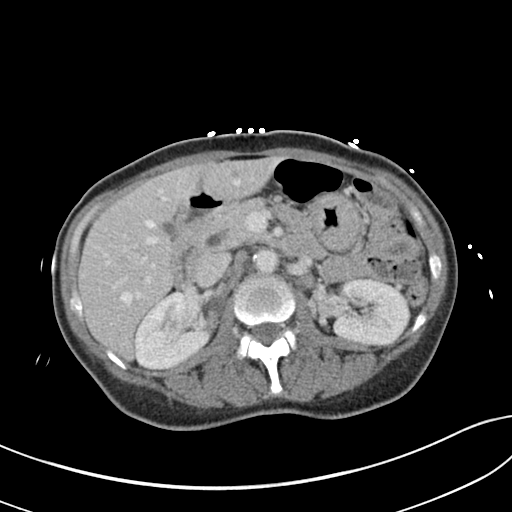
[im 70/82  soft-tissue]
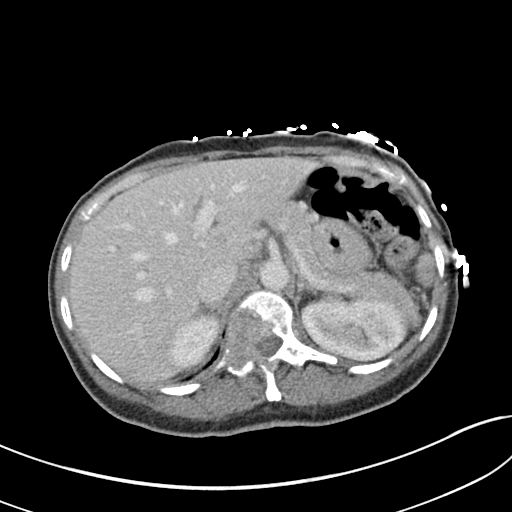
[im 78/82  soft-tissue]
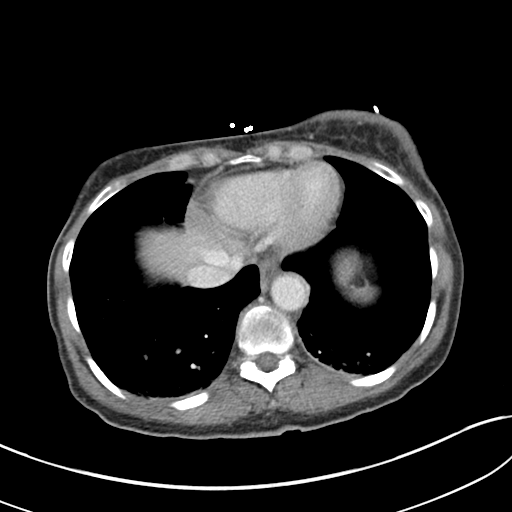

[Series 5: coronal st · coronal · 0.60mm/px · 3 of 78 slices shown]
[im 26/78  soft-tissue]
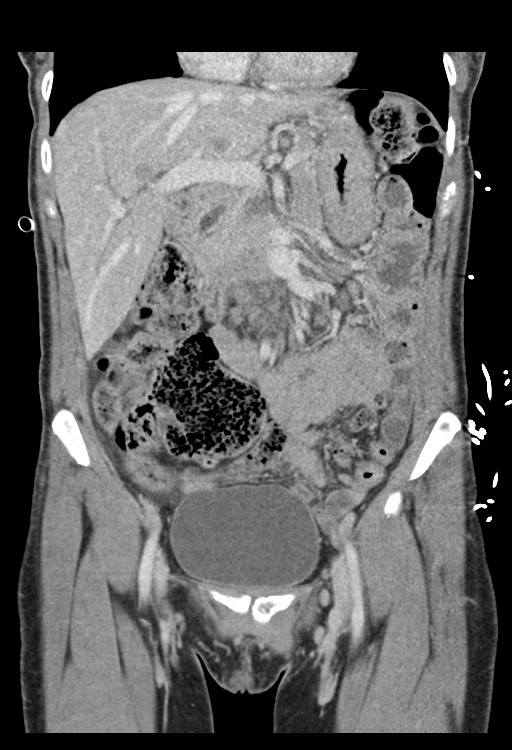
[im 35/78  soft-tissue]
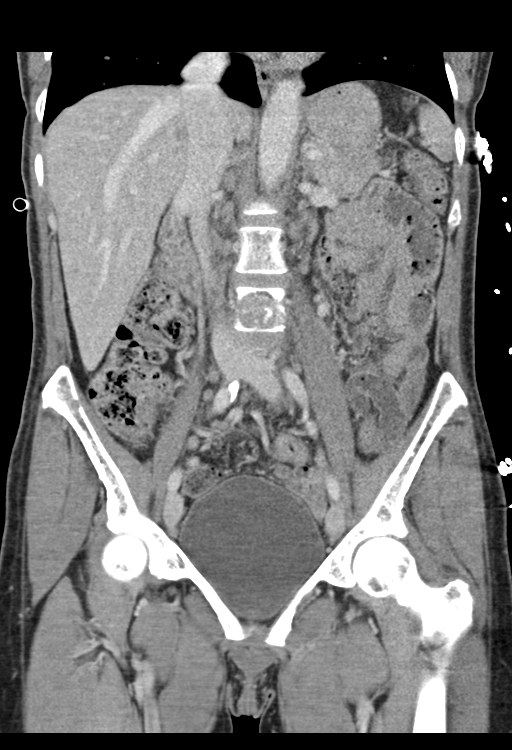
[im 43/78  soft-tissue]
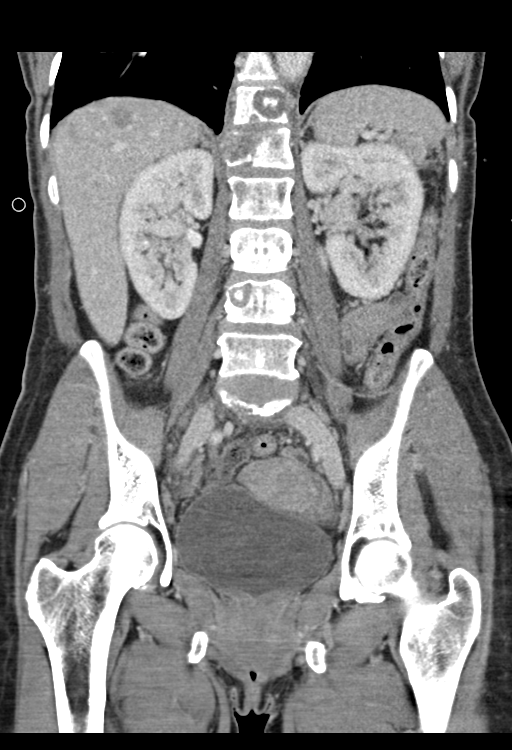

[15 of 46 positions shown; findings below may reference images not displayed]

FINDINGS: CTA CHEST FINDINGS

Cardiovascular: Satisfactory opacification of the pulmonary arteries
to the segmental level. No evidence of pulmonary embolism. Normal
heart size. No pericardial effusion.

Mediastinum/Nodes: Negative for mediastinal adenopathy. Mild hilar
nodal prominence not meeting pathologic measurement threshold.
Bilateral breast skin thickening which could be tumoral or treatment
related. Left axillary nodes show borderline size increased from
before.

Lungs/Pleura: Dependent atelectasis that is mild.

Musculoskeletal: Widespread osseous metastatic disease with lytic
lesions seen at most vertebral levels. Numerous bilateral rib
lesions are seen. Nonacute appearing T5 superior endplate fracture.

Review of the MIP images confirms the above findings.

CT ABDOMEN and PELVIS FINDINGS

Hepatobiliary: New ill-defined lesions in the liver with the largest
seen in the right lobe at 15 mm. No evidence of biliary obstruction
or inflammation

Pancreas: Negative

Spleen: Negative

Adrenals/Urinary Tract: Adrenal glands are unremarkable. Kidneys are
normal, without renal calculi, focal lesion, or hydronephrosis.
Bladder is unremarkable.

Stomach/Bowel: No evidence of obstruction or inflammation.
Fecalization of terminal ileal contents attributed to slow transit.

Vascular/Lymphatic: There are prominent retroperitoneal lymph nodes
but not definitely pathologic/metastatic

Reproductive: Calcification in the right adnexa, incidental.

Other: No ascites or pneumoperitoneum

Musculoskeletal: Numerous skeletal metastases with large
transcortical lesions seen at multiple levels in the lumbar spine
and pelvis. There was dedicated lumbar spine reformats that are
described separately. In the pelvis there is notable left posterior
ilium, right sacral ala, and left ischial tuberosity metastases with
extraosseous tumor extension.

Review of the MIP images confirms the above findings.
IMPRESSION: 1. Negative for pulmonary embolism. No acute intra-abdominal
finding.
2. Progressive metastatic disease since July 2020, now advanced and
widespread in the skeleton and newly seen in the liver.
